# Patient Record
Sex: Female | Born: 1985 | Race: Black or African American | Hispanic: No | Marital: Single | Smoking: Never smoker
Health system: Southern US, Community
[De-identification: ages and names within clinical notes are randomized; demographics above are authoritative.]

## PROBLEM LIST (undated history)

## (undated) ENCOUNTER — Ambulatory Visit: Source: Home / Self Care

## (undated) DIAGNOSIS — Z98891 History of uterine scar from previous surgery: Secondary | ICD-10-CM

## (undated) DIAGNOSIS — Z789 Other specified health status: Secondary | ICD-10-CM

## (undated) DIAGNOSIS — B9689 Other specified bacterial agents as the cause of diseases classified elsewhere: Secondary | ICD-10-CM

## (undated) DIAGNOSIS — N760 Acute vaginitis: Secondary | ICD-10-CM

---
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
## 2003-08-01 ENCOUNTER — Emergency Department (HOSPITAL_COMMUNITY): Admission: EM | Admit: 2003-08-01 | Discharge: 2003-08-01 | Payer: Self-pay | Admitting: Emergency Medicine

## 2003-11-13 ENCOUNTER — Emergency Department (HOSPITAL_COMMUNITY): Admission: EM | Admit: 2003-11-13 | Discharge: 2003-11-13 | Payer: Self-pay | Admitting: Emergency Medicine

## 2004-01-02 ENCOUNTER — Emergency Department (HOSPITAL_COMMUNITY): Admission: EM | Admit: 2004-01-02 | Discharge: 2004-01-02 | Payer: Self-pay | Admitting: Emergency Medicine

## 2004-03-03 ENCOUNTER — Emergency Department (HOSPITAL_COMMUNITY): Admission: EM | Admit: 2004-03-03 | Discharge: 2004-03-03 | Payer: Self-pay | Admitting: Emergency Medicine

## 2004-05-21 ENCOUNTER — Emergency Department (HOSPITAL_COMMUNITY): Admission: EM | Admit: 2004-05-21 | Discharge: 2004-05-21 | Payer: Self-pay | Admitting: Emergency Medicine

## 2005-09-23 ENCOUNTER — Emergency Department (HOSPITAL_COMMUNITY): Admission: EM | Admit: 2005-09-23 | Discharge: 2005-09-23 | Payer: Self-pay | Admitting: Emergency Medicine

## 2006-03-01 ENCOUNTER — Ambulatory Visit (HOSPITAL_COMMUNITY): Admission: RE | Admit: 2006-03-01 | Discharge: 2006-03-01 | Payer: Self-pay | Admitting: Obstetrics and Gynecology

## 2006-03-24 ENCOUNTER — Inpatient Hospital Stay (HOSPITAL_COMMUNITY): Admission: AD | Admit: 2006-03-24 | Discharge: 2006-03-24 | Payer: Self-pay | Admitting: Obstetrics and Gynecology

## 2006-04-19 ENCOUNTER — Ambulatory Visit (HOSPITAL_COMMUNITY): Admission: RE | Admit: 2006-04-19 | Discharge: 2006-04-19 | Payer: Self-pay | Admitting: Obstetrics and Gynecology

## 2006-05-02 ENCOUNTER — Encounter (INDEPENDENT_AMBULATORY_CARE_PROVIDER_SITE_OTHER): Payer: Self-pay | Admitting: Specialist

## 2006-05-02 ENCOUNTER — Inpatient Hospital Stay (HOSPITAL_COMMUNITY): Admission: RE | Admit: 2006-05-02 | Discharge: 2006-05-05 | Payer: Self-pay | Admitting: Obstetrics and Gynecology

## 2006-11-15 ENCOUNTER — Inpatient Hospital Stay (HOSPITAL_COMMUNITY): Admission: AD | Admit: 2006-11-15 | Discharge: 2006-11-15 | Payer: Self-pay | Admitting: Obstetrics and Gynecology

## 2007-01-24 ENCOUNTER — Inpatient Hospital Stay (HOSPITAL_COMMUNITY): Admission: AD | Admit: 2007-01-24 | Discharge: 2007-01-24 | Payer: Self-pay | Admitting: Obstetrics & Gynecology

## 2007-04-01 ENCOUNTER — Inpatient Hospital Stay (HOSPITAL_COMMUNITY): Admission: RE | Admit: 2007-04-01 | Discharge: 2007-04-01 | Payer: Self-pay | Admitting: Obstetrics and Gynecology

## 2007-04-01 ENCOUNTER — Ambulatory Visit (HOSPITAL_COMMUNITY): Admission: RE | Admit: 2007-04-01 | Discharge: 2007-04-01 | Payer: Self-pay | Admitting: Obstetrics and Gynecology

## 2007-04-02 ENCOUNTER — Inpatient Hospital Stay (HOSPITAL_COMMUNITY): Admission: RE | Admit: 2007-04-02 | Discharge: 2007-04-04 | Payer: Self-pay | Admitting: Obstetrics and Gynecology

## 2007-04-02 ENCOUNTER — Encounter (INDEPENDENT_AMBULATORY_CARE_PROVIDER_SITE_OTHER): Payer: Self-pay | Admitting: Obstetrics and Gynecology

## 2008-08-16 ENCOUNTER — Emergency Department (HOSPITAL_COMMUNITY): Admission: EM | Admit: 2008-08-16 | Discharge: 2008-08-16 | Payer: Self-pay | Admitting: Emergency Medicine

## 2008-10-05 IMAGING — US US OB LIMITED
1 series · 14 of 18 positions shown · non-contrast
Comparison: none

CLINICAL DATA: 38 week 0 day assigned gestational age.  Breech presentation on prior ultrasound.  Reassess presentation.

[Series 1: us ob limited · 0.32mm/px · 18 acquisitions, 14 frames shown]
[im 1/18]
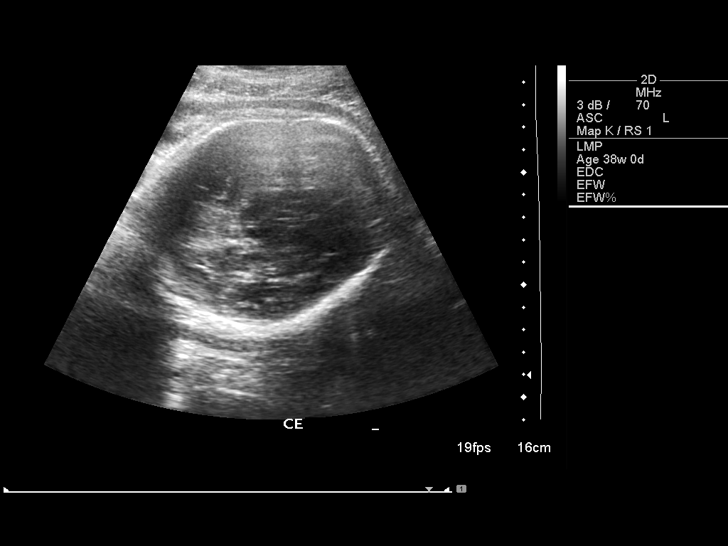
[im 2/18]
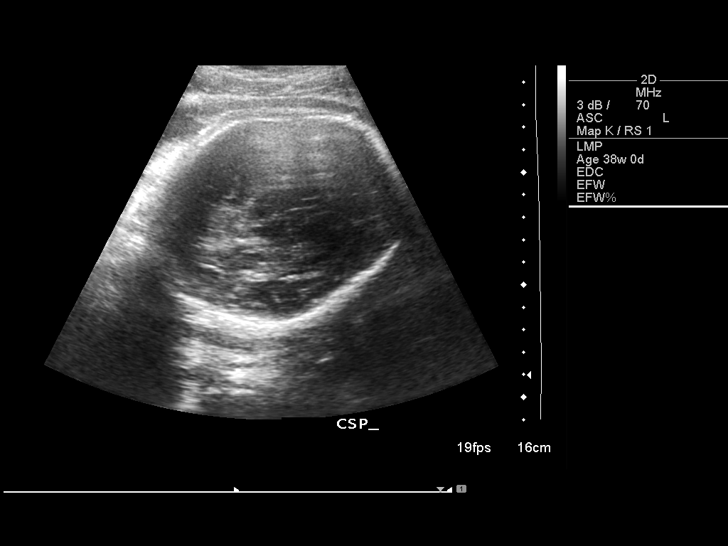
[im 4/18]
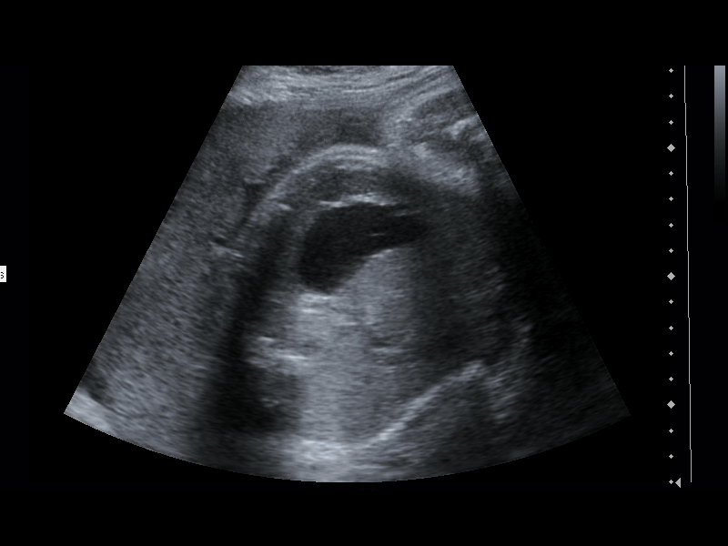
[im 5/18]
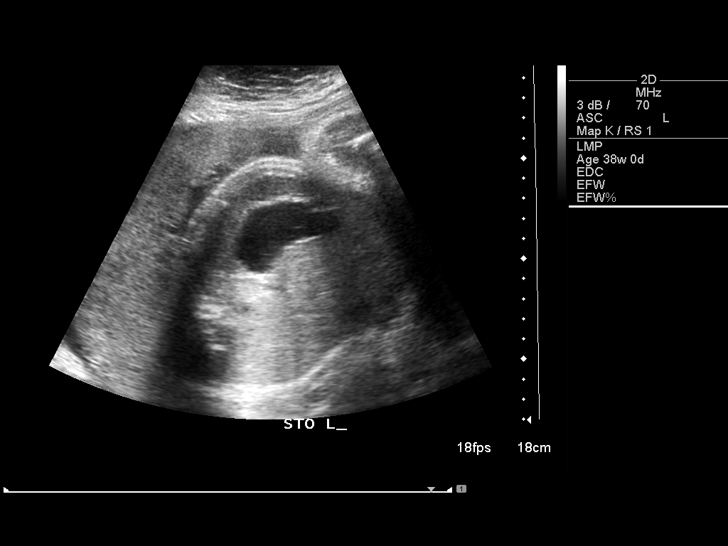
[im 6/18]
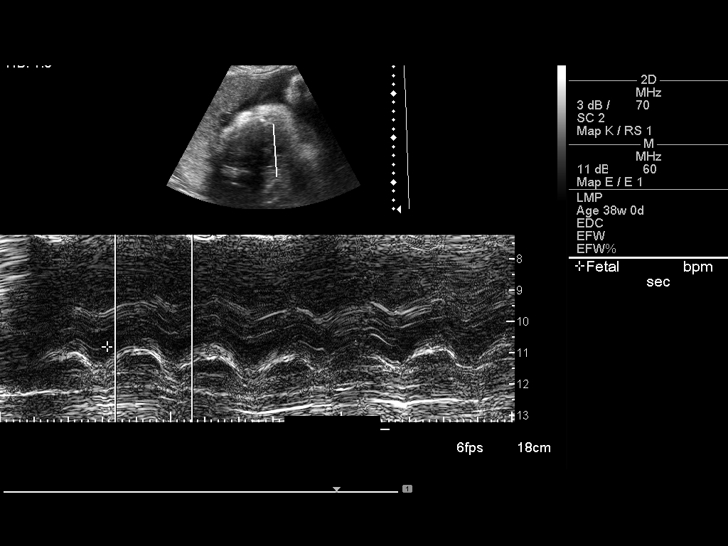
[im 8/18]
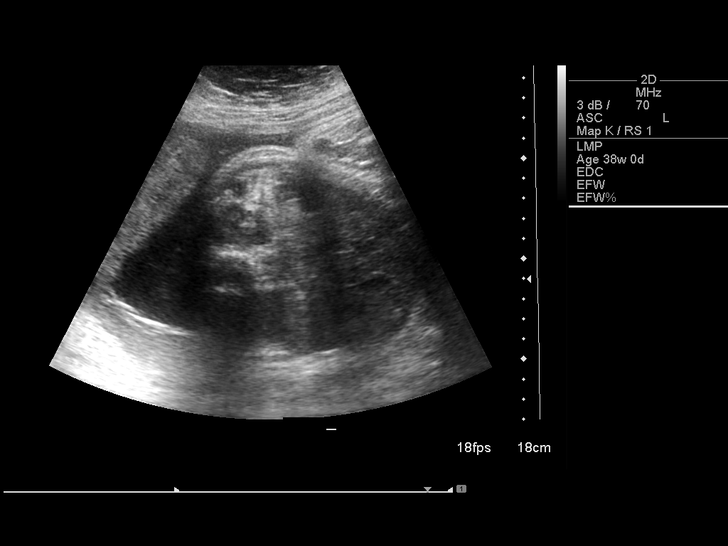
[im 9/18]
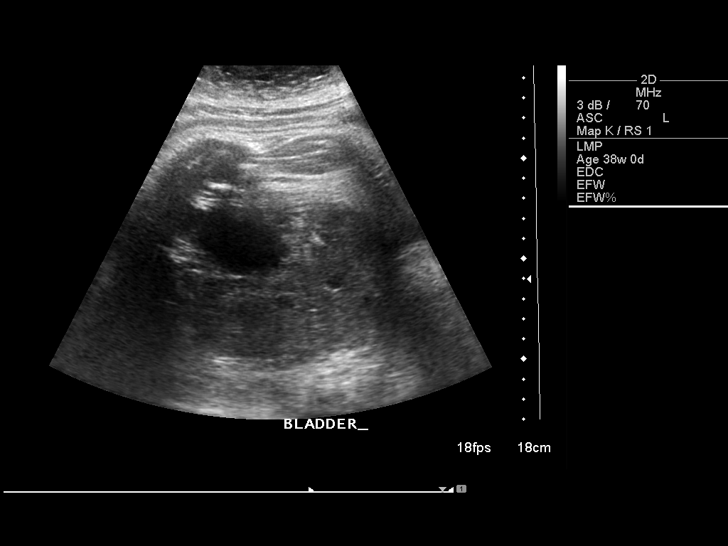
[im 10/18]
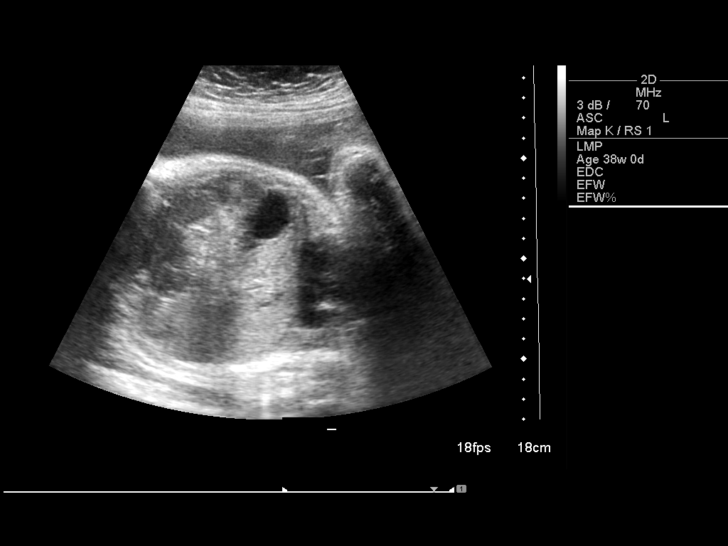
[im 11/18]
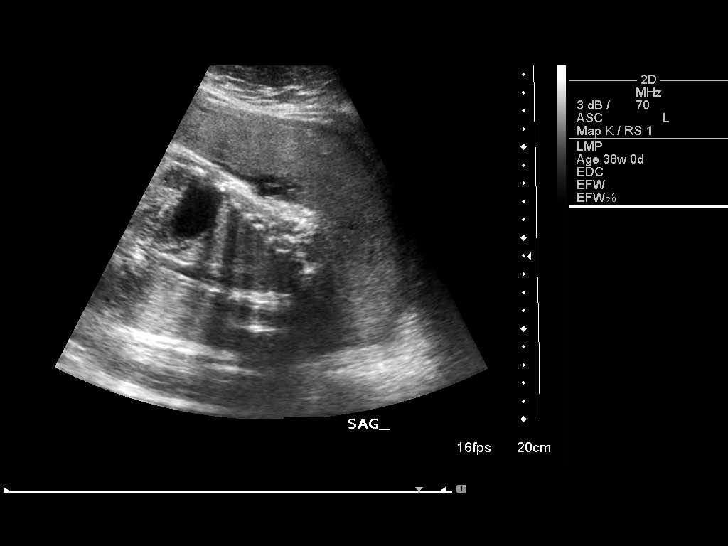
[im 13/18]
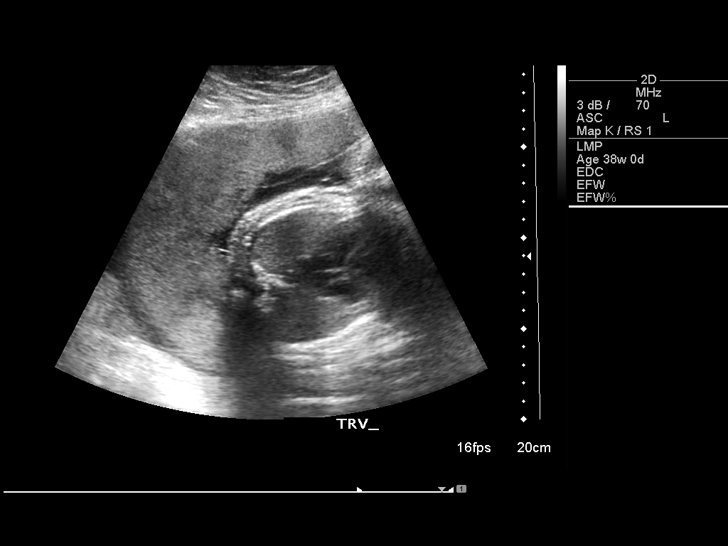
[im 14/18]
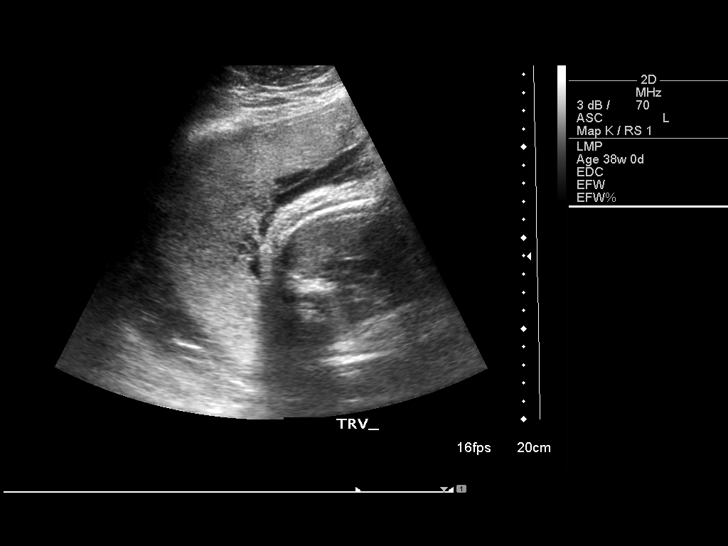
[im 15/18]
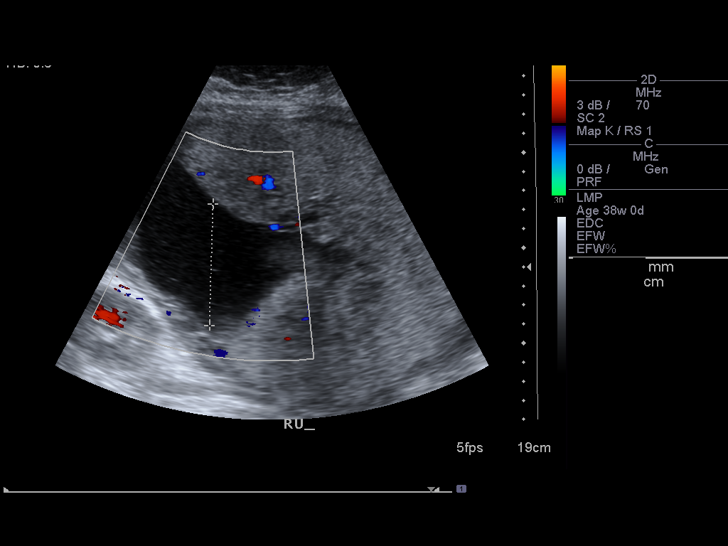
[im 17/18]
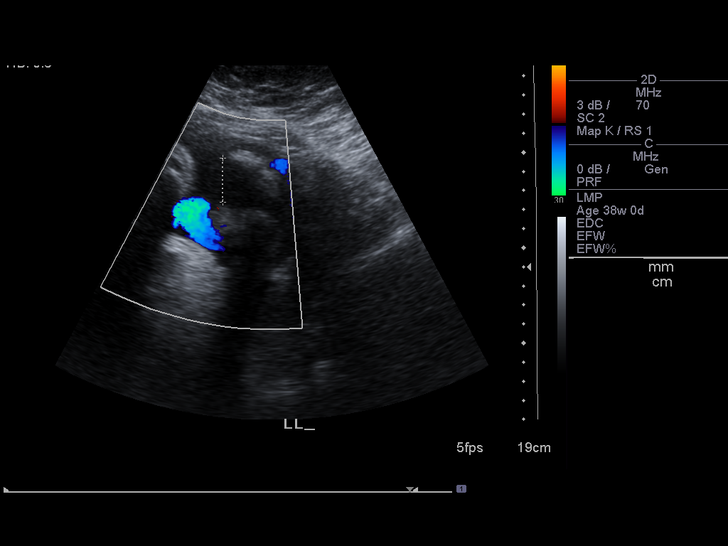
[im 18/18]
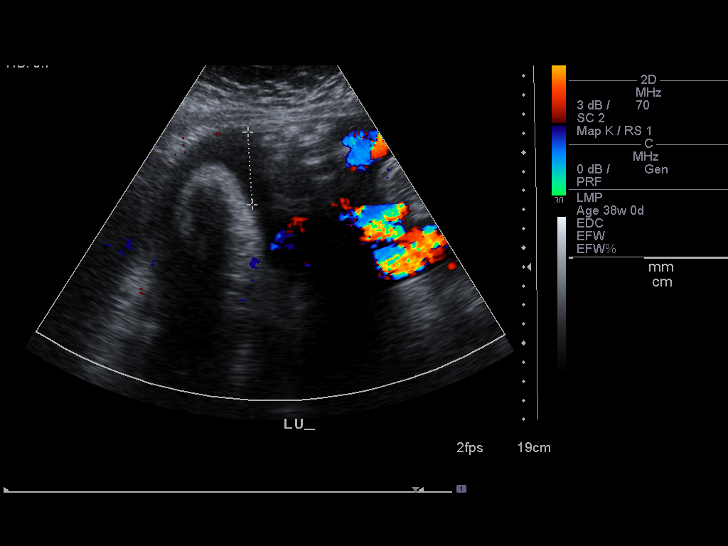

[14 of 18 positions shown; findings below may reference images not displayed]

LIMITED OBSTETRICAL ULTRASOUND:
 Number of Fetuses:  1
 Heart Rate:  133 bpm
 Movement:  Yes
 Breathing:  Yes
 Presentation:  Cephalic
 Placental Location:  Anterior
 Grade:  I
 Previa:  No
 Amniotic Fluid (Subjective):  Normal
 Amniotic Fluid (Objective):  AFI 15.9 cm (0th-00th %ile = 7.3 to 23.9 cm for 38 weeks) 

 Fetal measurements and complete anatomic evaluation were not requested.  The following fetal anatomy was visualized during this exam:  Lateral ventricles, thalami/CSP, stomach, kidneys, bladder, and diaphragm.  

 MATERNAL UTERINE AND ADNEXAL FINDINGS
 Cervix:  Not evaluated; >34 weeks.
IMPRESSION: Single living intrauterine fetus now in cephalic presentation.

## 2010-05-06 ENCOUNTER — Encounter: Payer: Self-pay | Admitting: Obstetrics and Gynecology

## 2010-05-07 ENCOUNTER — Encounter: Payer: Self-pay | Admitting: Obstetrics and Gynecology

## 2010-05-16 ENCOUNTER — Emergency Department (HOSPITAL_COMMUNITY)
Admission: EM | Admit: 2010-05-16 | Discharge: 2010-05-16 | Payer: Self-pay | Source: Home / Self Care | Admitting: Family Medicine

## 2010-05-16 LAB — POCT URINALYSIS DIPSTICK
Bilirubin Urine: NEGATIVE
Hgb urine dipstick: NEGATIVE
Ketones, ur: NEGATIVE mg/dL
Nitrite: NEGATIVE
Protein, ur: NEGATIVE mg/dL
Specific Gravity, Urine: 1.02 (ref 1.005–1.030)
Urine Glucose, Fasting: NEGATIVE mg/dL
Urobilinogen, UA: 0.2 mg/dL (ref 0.0–1.0)
pH: 7 (ref 5.0–8.0)

## 2010-05-16 LAB — POCT PREGNANCY, URINE: Preg Test, Ur: NEGATIVE

## 2010-05-31 ENCOUNTER — Emergency Department (HOSPITAL_COMMUNITY)
Admission: EM | Admit: 2010-05-31 | Discharge: 2010-05-31 | Disposition: A | Discharge status: Home / Self Care | Payer: Self-pay | Attending: Emergency Medicine | Admitting: Emergency Medicine

## 2010-05-31 DIAGNOSIS — J020 Streptococcal pharyngitis: Secondary | ICD-10-CM | POA: Insufficient documentation

## 2010-05-31 LAB — RAPID STREP SCREEN (MED CTR MEBANE ONLY): Streptococcus, Group A Screen (Direct): POSITIVE — AB

## 2010-08-29 NOTE — Op Note (Signed)
[name], [name]               ACCOUNT NO.:  [id number]   MEDICAL RECORD NO.:  [id number]          PATIENT TYPE:  INP   LOCATION:  9105                          FACILITY:  [facility]   PHYSICIAN:  Malachi Pro. Ambrose Mantle, M.D. DATE OF BIRTH:  06-06-1985   DATE OF PROCEDURE:  04/02/2007  DATE OF DISCHARGE:                               OPERATIVE REPORT   PREOPERATIVE DIAGNOSIS:  Intrauterine pregnancy, 39+ weeks, prior C-  section, declined vaginal birth after cesarean.   POSTOPERATIVE DIAGNOSIS:  Intrauterine pregnancy, 39+ weeks, prior C-  section, declined vaginal birth after cesarean.   OPERATION:  Low-transverse cervical C-section.   OPERATOR:  Malachi Pro. Ambrose Mantle, M.D.   Threasa HeadsSenaida Ores.   Spinal anesthesia.  The patient was brought to the operating room and  placed under spinal anesthesia by Dr. Pamalee Leyden.  She was placed in left  lateral tilt position.  The abdomen was prepped with Betadine solution.  A Foley catheter was inserted to drainage after this urethra was  prepped.  Exam was done to check to see how low the presenting part was,  and the vertex was very high.  The abdomen was then draped as a sterile  field.  What was felt to be her old transverse incision was utilized,  and the incision was carried through the skin, subcutaneous tissue and  fascia.  The fascia was then separated from the rectus muscles  superiorly and inferiorly.  The peritoneum was entered during the  separation of the fascia from the rectus muscle superiorly.  The  peritoneal incision was entered.  I stretched to see if I could increase  the size of the opening, then used retractors to visualize the lower  uterine segment.  The baby's head was slightly above the lower uterine  segment, so I made an incision over the head into the myometrium and  went the rest of the way into the uterus with my finger.  A large amount  of clear fluid was obtained.  The incision was enlarged transversely  with blunt  dissection.  I elevated the baby's head into the incisional  opening but could not get the baby's head out, so we placed a vacuum on  the baby's head, and then without any pop offs, the head was delivered  through the incisional opening.  The nose and pharynx was suctioned with  the bulb.  The infant's body was then delivered.  The cord was clamped,  and the infant was given to Dr. Venetia Constable was in attendance.  She  assigned the 8-pound 2-ounce infant Apgars of 9 at one and 9 at five  minutes.  The placenta was removed intact.  I inspected the inside of  the uterus.  There were few little membranes left that I removed.  I  dilated the cervix with the ring forceps and then closed the uterine  incision with a running suture of 0 Vicryl and then used another 0  Vicryl running suture to imbricate the first layer.  The first layer was  locked the second layer was nonlocking.  I had tried to elevate the  uterus out of the abdomen, but it did not seem to want to come out, but  I do not think it was adherent.  It was a little boggy at first, and I  used vigorous massage as well as Pitocin to try to increase the strength  of the uterine contractions.  I inspected both tubes and ovaries.  They  were both normal.  There was no significant bleeding from the incision.  I did put an extra figure-of-eight suture at the left angle.  I checked  the bladder.  We were well away from the bladder.  There were very dense  varicosities over the lower uterine segment.  I then closed the  abdominal wall using interrupted sutures of 0 Vicryl through the muscle  including the peritoneum, two running sutures of 0 Vicryl in the fascia,  a running 3-0 Vicryl in the subcu tissue and staples on the skin.  The  patient seemed to tolerate  the procedure well.  Blood loss was estimated at 1200 mL, although it  was difficult to tell how much was blood and how much was fluid.  I did  not feel like there had been an excessive  amount of bleeding because the  uterine edges did not seem to bleed significantly.  The patient was  returned to recovery in satisfactory condition.      Malachi Pro. Ambrose Mantle, M.D.  Electronically Signed     TFH/MEDQ  D:  04/02/2007  T:  04/03/2007  Job:  [id number]

## 2010-08-29 NOTE — H&P (Signed)
[name], [name]               ACCOUNT NO.:  [id number]   MEDICAL RECORD NO.:  [id number]          PATIENT TYPE:  OUT   LOCATION:  MFM                           [facility]ACILITY:  [facility]   PHYSICIAN:  Malachi Pro. Ambrose Mantle, M.D. DATE OF BIRTH:  1986-03-18   DATE OF ADMISSION:  04/01/2007  DATE OF DISCHARGE:                              HISTORY & PHYSICAL   PRESENT ILLNESS:  This is a 25 year old black female, para 2-0-0-2,  gravida 3, EDC April 08, 2007, by late ultrasound, admitted for  repeat C-section after pulmonary maturity studies.  Blood group and type  A-positive, negative antibodies, sickle cell negative, RPR nonreactive,  rubella immune, hepatitis B surface antigen negative, HIV negative, GC  and Chlamydia negative.  One hour Glucola 79.  Group B strep negative.  This patient began her prenatal course on February 11, 2007, at what was  thought to be [redacted] weeks gestation.  She had had an ultrasound at [facility] on October 10 that showed an average gestational age of [redacted]  weeks and 3 days, [facility] April 08, 2007.  She had a history of a vaginal  birth in May of 2003 and a cesarean section in January of 2008, and she  wanted a repeat C-section.  I advised her on her first visit that she  would need maturity studies, which she has undergone and they showed an  LS ratio of 4.3:1 and PG was present.  After beginning her prenatal  course, late in pregnancy, she has had no significant abnormalities.  Her blood pressure has remained normal.  The fundal height is high at 44  cm on March 31, 2007, but otherwise she has had no major problems.   PAST MEDICAL HISTORY:  Reveals no known allergies.  OPERATIONS:  Cesarean section in 2008.  ILLNESSES:  No significant illnesses.   FAMILY HISTORY:  Mother has high blood pressure, also has anemia.  A  sister has anemia and seizures as a child.   ALCOHOL TOBACCO AND DRUGS:  None.   OBSTETRIC HISTORY:  In May of 2008, the patient delivered a  7-pound 13-  ounce female vaginally, without complications, except she did have  increased temperature in labor.  In January of 2008, she had an 8-pound  14-ounce female infant by C-section from a transverse lie, according to  her history.  The records that I reviewed pertaining to the C-section do  not confirm the transverse lie.  According to the op note, there was an  arrest of dilatation and descent in labor and the presenting part was  vertex at a -3 station.  She underwent a low transverse cervical C-  section.   On the day prior to admission, the patient underwent an amniocentesis.  We identified a spot in the right upper quadrant of the uterus where  there did not seem to be any cord.  There did seem to be placenta  present and the marked spot was 6.5 cm from the abdominal wall.  I  inserted a needle this distance, got no fluid, got a couple of drops of  blood,  could not identify where the needle was with the ultrasound, so I  removed the needle, but during that process, the patient became hot and  sweaty and weak.  She was tilted to her left side, the uterus was  displaced to the left and fetal heart tones were confirmed at 84 with  the ultrasound, but rapidly returned to over 100.  She then was sent to  MAU after the amniocentesis was completed at a different site with clear  fluid and a nonstress test was reactive.   PHYSICAL EXAM:  Revealed a well-developed, quite obese, black female, in  no distress.  She is 5 feet 9 inches, weighs 320 pounds.  Blood pressure 110/70, pulse  of 80.  HEAD, EYES, EARS, NOSE AND THROAT:  Reveal no cranial abnormalities.  Extraocular movements are intact.  Nose and pharynx are clear.  NECK:  Supple without thyromegaly.  HEART:  Normal size and sounds, no murmurs.  LUNGS:  Clear to auscultation.  ABDOMEN:  Soft.  Fundal height is 44 cm.  Fetal heart tones are normal.  Cervix is a fingertip and long, but the vertex is present at a -4   station.   ADMITTING IMPRESSION:  Intrauterine pregnancy at 39+ weeks by dates,  confirmed by late ultrasound with positive PG and LS ratio of 4.3:1 on  amniocentesis.  History of cesarean section, declines vaginal birth after cesarean.   The patient is admitted for repeat C-section.  Her previous incision is  quite low.  I have informed her that most likely I will utilize an  incision above the area of the first one.      Malachi Pro. Ambrose Mantle, M.D.  Electronically Signed     TFH/MEDQ  D:  04/02/2007  T:  04/02/2007  Job:  [id number]

## 2010-08-29 NOTE — Discharge Summary (Signed)
[name], [name]               ACCOUNT NO.:  [id number]   MEDICAL RECORD NO.:  [id number]          PATIENT TYPE:  INP   LOCATION:  9105                          [facility]   PHYSICIAN:  Malachi Pro. Ambrose Mantle, M.D. DATE OF BIRTH:  06-16-1985   DATE OF ADMISSION:  04/02/2007  DATE OF DISCHARGE:  04/04/2007                               DISCHARGE SUMMARY   HISTORY:  The patient's history and physical is detailed in the  admitting history and physical.  In summary, the patient was admitted  for a repeat low transverse cervical C-section after declining vaginal  birth after cesarean.  She underwent a low transverse cervical C-section  by Dr. Ambrose Mantle with Dr. Senaida Ores assisting under spinal anesthesia with  delivery of an 8 pound 2 ounce female infant without Apgars of 9 at one  and 9 at five minutes.  The uterus, tubes and ovaries appeared normal.  Blood loss was difficult to estimate because of a large amount of  amniotic fluid but it was estimated at 1200 mL.  Postpartum the patient  did very well.  She tolerated a diet, ambulated well without difficulty,  passed flatus and requested discharge on the second postpartum day.  Initial hemoglobin 10.6, hematocrit 31.4, white count 7500, platelet  count 161,000, 75 segs, 16 lymphs, 7 monos, 1 eosinophil.  Followup  hemoglobin was 8.5, hematocrit 24.9, platelet count 127,000.  RPR was  nonreactive.   FINAL DIAGNOSES:  Intrauterine pregnancy at 39 plus weeks, delivered  vertex by C-section.   OPERATION:  Low transverse cervical C-section.   FINAL CONDITION:  Improved.   INSTRUCTIONS:  Include our regular discharge instruction booklet.  The  patient is advised to return to the office in 3-4 days to have her  staples removed.  Prescription for Percocet 5/325 36 tablets one or two  every 3-4 hours as needed for pain is given at discharge.  She is also  advised to take iron sulfate twice a day for the anemia.      Malachi Pro. Ambrose Mantle,  M.D.  Electronically Signed     TFH/MEDQ  D:  04/04/2007  T:  04/04/2007  Job:  [id number]

## 2011-01-19 LAB — DIFFERENTIAL
Basophils Absolute: 0
Basophils Relative: 0
Eosinophils Absolute: 0.1 — ABNORMAL LOW
Eosinophils Relative: 1
Lymphocytes Relative: 16
Lymphs Abs: 1.2
Monocytes Absolute: 0.6
Monocytes Relative: 7
Neutro Abs: 5.6
Neutrophils Relative %: 75

## 2011-01-19 LAB — RPR: RPR Ser Ql: NONREACTIVE

## 2011-01-19 LAB — CBC
HCT: 24.9 — ABNORMAL LOW
HCT: 31.4 — ABNORMAL LOW
Hemoglobin: 10.6 — ABNORMAL LOW
Hemoglobin: 8.5 — ABNORMAL LOW
MCHC: 33.8
MCHC: 34.3
MCV: 87.6
MCV: 88
Platelets: 127 — ABNORMAL LOW
Platelets: 161
RBC: 2.84 — ABNORMAL LOW
RBC: 3.57 — ABNORMAL LOW
RDW: 14.4
RDW: 14.8
WBC: 7.5
WBC: 7.5

## 2011-01-19 LAB — LSPG (L/S RATIO WITH PG)-AMNIO FLUID

## 2011-01-19 LAB — FLM(FETAL LUNG MATURITY), AMNIOTIC FLUID
Fetal/Lung Maturity: 65.5
Gestational Age: 39

## 2011-01-22 ENCOUNTER — Inpatient Hospital Stay (HOSPITAL_COMMUNITY): Payer: Medicaid Other

## 2011-01-22 ENCOUNTER — Encounter (HOSPITAL_COMMUNITY): Payer: Self-pay

## 2011-01-22 ENCOUNTER — Inpatient Hospital Stay (HOSPITAL_COMMUNITY)
Admission: AD | Admit: 2011-01-22 | Discharge: 2011-01-22 | Disposition: A | Discharge status: Home / Self Care | Payer: Medicaid Other | Source: Ambulatory Visit | Attending: Obstetrics & Gynecology | Admitting: Obstetrics & Gynecology

## 2011-01-22 ENCOUNTER — Inpatient Hospital Stay (INDEPENDENT_AMBULATORY_CARE_PROVIDER_SITE_OTHER)
Admission: RE | Admit: 2011-01-22 | Discharge: 2011-01-22 | Disposition: A | Discharge status: Home / Self Care | Payer: Self-pay | Source: Ambulatory Visit | Attending: Family Medicine | Admitting: Family Medicine

## 2011-01-22 DIAGNOSIS — Z3490 Encounter for supervision of normal pregnancy, unspecified, unspecified trimester: Secondary | ICD-10-CM

## 2011-01-22 DIAGNOSIS — N760 Acute vaginitis: Secondary | ICD-10-CM | POA: Insufficient documentation

## 2011-01-22 DIAGNOSIS — B9689 Other specified bacterial agents as the cause of diseases classified elsewhere: Secondary | ICD-10-CM | POA: Insufficient documentation

## 2011-01-22 DIAGNOSIS — O2390 Unspecified genitourinary tract infection in pregnancy, unspecified trimester: Secondary | ICD-10-CM | POA: Insufficient documentation

## 2011-01-22 DIAGNOSIS — A499 Bacterial infection, unspecified: Secondary | ICD-10-CM | POA: Insufficient documentation

## 2011-01-22 DIAGNOSIS — Z331 Pregnant state, incidental: Secondary | ICD-10-CM

## 2011-01-22 DIAGNOSIS — N949 Unspecified condition associated with female genital organs and menstrual cycle: Secondary | ICD-10-CM

## 2011-01-22 DIAGNOSIS — Z1389 Encounter for screening for other disorder: Secondary | ICD-10-CM

## 2011-01-22 DIAGNOSIS — R109 Unspecified abdominal pain: Secondary | ICD-10-CM | POA: Insufficient documentation

## 2011-01-22 HISTORY — DX: Other specified health status: Z78.9

## 2011-01-22 LAB — POCT URINALYSIS DIP (DEVICE)
Bilirubin Urine: NEGATIVE
Glucose, UA: NEGATIVE mg/dL
Hgb urine dipstick: NEGATIVE
Ketones, ur: NEGATIVE mg/dL
Leukocytes, UA: NEGATIVE
Nitrite: NEGATIVE
Protein, ur: NEGATIVE mg/dL
Specific Gravity, Urine: 1.015 (ref 1.005–1.030)
Urobilinogen, UA: 0.2 mg/dL (ref 0.0–1.0)
pH: 8.5 — ABNORMAL HIGH (ref 5.0–8.0)

## 2011-01-22 LAB — WET PREP, GENITAL: Yeast Wet Prep HPF POC: NONE SEEN

## 2011-01-22 LAB — POCT PREGNANCY, URINE: Preg Test, Ur: POSITIVE

## 2011-01-22 MED ORDER — METRONIDAZOLE 500 MG PO TABS: 500.0000 mg | ORAL_TABLET | Freq: Two times a day (BID) | ORAL | Status: AC

## 2011-01-22 NOTE — ED Provider Notes (Signed)
History     Chief Complaint  Patient presents with  . Abdominal Pain   HPI + UPT. C/O constant pelvic pain x 2-3 days. No bleeding. Was seen at Urgent Care today, had + UPT, wet prep with many clue cells (not treated for BV), was treated empirically for GC/CT d/t partner having urethral discharge and dysuria.   OB History    Grav Para Term Preterm Abortions TAB SAB Ect Mult Living   4 3 3       3       Past Medical History  Diagnosis Date  . No pertinent past medical history     Past Surgical History  Procedure Date  . Cesarean section     Breech, 2nd FTD    No family history on file.  History  Substance Use Topics  . Smoking status: Never Smoker   . Smokeless tobacco: Never Used  . Alcohol Use: No    Allergies: Allergies not on file  No prescriptions prior to admission    Review of Systems  Constitutional: Negative.   Respiratory: Negative.   Cardiovascular: Negative.   Gastrointestinal: Positive for abdominal pain. Negative for nausea, vomiting, diarrhea and constipation.  Genitourinary: Negative for dysuria, urgency, frequency, hematuria and flank pain.       Negative for vaginal bleeding  Musculoskeletal: Negative.   Neurological: Negative.   Psychiatric/Behavioral: Negative.    Physical Exam   Blood pressure 121/61, pulse 84, temperature 98.6 F (37 C), temperature source Oral, resp. rate 16, height 5\' 9"  (1.753 m), weight 140.672 kg (310 lb 2 oz), last menstrual period 11/27/2010.  Physical Exam  Constitutional: She is oriented to person, place, and time. She appears well-developed and well-nourished. No distress.  Cardiovascular: Normal rate.   Respiratory: Effort normal.  Musculoskeletal: Normal range of motion.  Neurological: She is alert and oriented to person, place, and time.  Skin: Skin is warm and dry.  Psychiatric: She has a normal mood and affect.    MAU Course  Procedures  U/S shows 8 week 4 day IUP, small subchorionic hemorrhage     Assessment and Plan  25 y.o. [id number] at [redacted]w[redacted]d IUP - pregnancy verification letter given, start prenatal care ASAP BV - rx flagyl 500 mg 1 po bid Treated today at [facility] for GC/CT   Benz Vandenberghe 01/22/2011, 4:54 PM

## 2011-01-22 NOTE — Progress Notes (Signed)
Pt states was seen at [facility], came here for further evaluation. Denies pain, bleeding or vag d/c changes at present.

## 2011-01-23 LAB — GC/CHLAMYDIA PROBE AMP, GENITAL: GC Probe Amp, Genital: NEGATIVE

## 2011-01-25 LAB — RAPID URINE DRUG SCREEN, HOSP PERFORMED
Amphetamines: NOT DETECTED
Barbiturates: NOT DETECTED
Cocaine: NOT DETECTED
Opiates: NOT DETECTED

## 2011-01-25 LAB — ABO/RH: ABO/RH(D): A POS

## 2011-01-25 LAB — DIFFERENTIAL
Eosinophils Relative: 1
Lymphocytes Relative: 14
Lymphs Abs: 1.4
Monocytes Absolute: 0.5

## 2011-01-25 LAB — RAPID STREP SCREEN (MED CTR MEBANE ONLY): Streptococcus, Group A Screen (Direct): NEGATIVE

## 2011-01-25 LAB — URINALYSIS, ROUTINE W REFLEX MICROSCOPIC
Hgb urine dipstick: NEGATIVE
Nitrite: NEGATIVE
Specific Gravity, Urine: 1.015
Urobilinogen, UA: 0.2
pH: 7.5

## 2011-01-25 LAB — CBC
HCT: 32.1 — ABNORMAL LOW
Hemoglobin: 11.1 — ABNORMAL LOW
WBC: 9.9

## 2011-01-25 LAB — TYPE AND SCREEN

## 2011-01-29 LAB — WET PREP, GENITAL
Trich, Wet Prep: NONE SEEN
Yeast Wet Prep HPF POC: NONE SEEN

## 2011-01-29 LAB — URINALYSIS, ROUTINE W REFLEX MICROSCOPIC
Bilirubin Urine: NEGATIVE
Nitrite: NEGATIVE
Specific Gravity, Urine: 1.02
Urobilinogen, UA: 4 — ABNORMAL HIGH

## 2011-01-29 LAB — GC/CHLAMYDIA PROBE AMP, GENITAL: Chlamydia, DNA Probe: NEGATIVE

## 2011-03-13 LAB — OB RESULTS CONSOLE HIV ANTIBODY (ROUTINE TESTING): HIV: NONREACTIVE

## 2011-04-11 ENCOUNTER — Other Ambulatory Visit (HOSPITAL_COMMUNITY): Payer: Self-pay | Admitting: Obstetrics and Gynecology

## 2011-04-11 DIAGNOSIS — Z0489 Encounter for examination and observation for other specified reasons: Secondary | ICD-10-CM

## 2011-05-09 ENCOUNTER — Ambulatory Visit (HOSPITAL_COMMUNITY)
Admission: RE | Admit: 2011-05-09 | Discharge: 2011-05-09 | Disposition: A | Discharge status: Home / Self Care | Payer: Medicaid Other | Source: Ambulatory Visit | Attending: Obstetrics and Gynecology | Admitting: Obstetrics and Gynecology

## 2011-05-09 ENCOUNTER — Other Ambulatory Visit (HOSPITAL_COMMUNITY): Payer: Self-pay | Admitting: Obstetrics and Gynecology

## 2011-05-09 DIAGNOSIS — Z0489 Encounter for examination and observation for other specified reasons: Secondary | ICD-10-CM

## 2011-05-09 DIAGNOSIS — Z3689 Encounter for other specified antenatal screening: Secondary | ICD-10-CM | POA: Insufficient documentation

## 2011-05-09 DIAGNOSIS — O34219 Maternal care for unspecified type scar from previous cesarean delivery: Secondary | ICD-10-CM | POA: Insufficient documentation

## 2011-07-11 ENCOUNTER — Other Ambulatory Visit: Payer: Self-pay | Admitting: Obstetrics and Gynecology

## 2011-07-24 ENCOUNTER — Other Ambulatory Visit: Payer: Self-pay | Admitting: Obstetrics and Gynecology

## 2011-08-03 ENCOUNTER — Encounter (HOSPITAL_COMMUNITY): Payer: Self-pay

## 2011-08-15 ENCOUNTER — Encounter (HOSPITAL_COMMUNITY): Payer: Self-pay

## 2011-08-15 ENCOUNTER — Encounter (HOSPITAL_COMMUNITY)
Admission: RE | Admit: 2011-08-15 | Discharge: 2011-08-15 | Disposition: A | Discharge status: Home / Self Care | Payer: Medicaid Other | Source: Ambulatory Visit | Attending: Obstetrics and Gynecology | Admitting: Obstetrics and Gynecology

## 2011-08-15 LAB — DIFFERENTIAL
Eosinophils Absolute: 0.1 10*3/uL (ref 0.0–0.7)
Eosinophils Relative: 1 % (ref 0–5)
Lymphocytes Relative: 18 % (ref 12–46)
Lymphs Abs: 1.3 10*3/uL (ref 0.7–4.0)
Monocytes Absolute: 0.4 10*3/uL (ref 0.1–1.0)
Monocytes Relative: 6 % (ref 3–12)

## 2011-08-15 LAB — COMPREHENSIVE METABOLIC PANEL
AST: 11 U/L (ref 0–37)
Albumin: 2.5 g/dL — ABNORMAL LOW (ref 3.5–5.2)
Alkaline Phosphatase: 70 U/L (ref 39–117)
BUN: 4 mg/dL — ABNORMAL LOW (ref 6–23)
CO2: 25 mEq/L (ref 19–32)
Chloride: 102 mEq/L (ref 96–112)
Creatinine, Ser: 0.52 mg/dL (ref 0.50–1.10)
GFR calc non Af Amer: >90 mL/min (ref >90)
Potassium: 4.1 mEq/L (ref 3.5–5.1)
Total Bilirubin: 0.2 mg/dL — ABNORMAL LOW (ref 0.3–1.2)

## 2011-08-15 LAB — SURGICAL PCR SCREEN: MRSA, PCR: NEGATIVE

## 2011-08-15 LAB — URINALYSIS, ROUTINE W REFLEX MICROSCOPIC
Glucose, UA: NEGATIVE mg/dL
Hgb urine dipstick: NEGATIVE
Ketones, ur: NEGATIVE mg/dL
Protein, ur: NEGATIVE mg/dL
Urobilinogen, UA: 0.2 mg/dL (ref 0.0–1.0)

## 2011-08-15 LAB — CBC
HCT: 31.5 % — ABNORMAL LOW (ref 36.0–46.0)
MCH: 27.2 pg (ref 26.0–34.0)
MCV: 85.8 fL (ref 78.0–100.0)
RBC: 3.67 MIL/uL — ABNORMAL LOW (ref 3.87–5.11)
WBC: 7.3 10*3/uL (ref 4.0–10.5)

## 2011-08-15 LAB — URINE MICROSCOPIC-ADD ON

## 2011-08-15 NOTE — Patient Instructions (Addendum)
[name]  08/15/2011   Your procedure is scheduled on:  08/23/11  Enter through the Main Entrance of [facility] at 915 AM.  Pick up the phone at the desk and dial [phone].   Call this number if you have problems the morning of surgery: [phone]   Remember:   Do not eat food:After Midnight.  Do not drink clear liquids: After Midnight.  Take these medicines the morning of surgery with A SIP OF WATER: NA   Do not wear jewelry, make-up or nail polish.  Do not wear lotions, powders, or perfumes. You may wear deodorant.  Do not shave 48 hours prior to surgery.  Do not bring valuables to the hospital.  Contacts, dentures or bridgework may not be worn into surgery.  Leave suitcase in the car. After surgery it may be brought to your room.  For patients admitted to the hospital, checkout time is 11:00 AM the day of discharge.   Patients discharged the day of surgery will not be allowed to drive home.  Name and phone number of your driver: NA  Special Instructions: CHG Shower Use Special Wash: 1/2 bottle night before surgery and 1/2 bottle morning of surgery.   Please read over the following fact sheets that you were given: MRSA Information

## 2011-08-22 MED ORDER — CEFAZOLIN SODIUM-DEXTROSE 2-3 GM-% IV SOLR
2.0000 g | INTRAVENOUS | Status: AC
  Administered 2011-08-23: 2 g via INTRAVENOUS
  Filled 2011-08-22: qty 50

## 2011-08-22 NOTE — H&P (Signed)
[name], [name]               ACCOUNT NO.:  [id number]  MEDICAL RECORD NO.:  [id number]  LOCATION:  SDC                           [facility]ILITY:  [facility]  PHYSICIAN:  Malachi Pro. Ambrose Mantle, M.D. DATE OF BIRTH:  1985-06-09  DATE OF ADMISSION:  08/15/2011 DATE OF DISCHARGE:  08/15/2011                             HISTORY & PHYSICAL   This is a 26 year old black female, para 3-0-0-3, gravida 4, who is admitted for a repeat C-section, having had 2 prior C-sections.  Her EDC is Aug 30, 2011, by an ultrasound done at 8 weeks and 4 days on January 22, 2011.  Blood group and type A positive, negative antibody, rubella is equivocal, RPR nonreactive.  Urine culture negative.  Hepatitis B surface antigen negative.  HIV negative.  TSH 1.23, hemoglobin AA, GC and Chlamydia negative.  Cystic fibrosis negative.  Quad screen negative.  One-hour Glucola 73.  Group B strep is positive.  This patient began her prenatal course in our office at 15 weeks and 5 days. She was massively obese weighing 319.5 pounds at her first visit.  She was weighed on our scales until she exceeded 350 pounds after July 31, 2011.  She has had a relatively benign prenatal course.  Since she had, had 2 previous C-sections, I did not offer her a vaginal birth and she is admitted for repeat C-section.  PAST MEDICAL HISTORY:  She has had no significant illnesses other than her massive obesity.  She has had 2 previous C-sections in January 2008 and December 2008.  She has no known allergies.  FAMILY HISTORY:  Her mother has high blood pressure and anemia and a sister has anemia and seizure disorder.  In 2003, she had a 7 pound, 12 ounce female vaginally.  In 2008, on 2 occasions, she delivered by C- section an 8 pound, 12 ounce female and 8 pound, 14 ounce female.  She denies alcohol, drugs and tobacco use.  On Aug 22, 2011, her blood pressure is 120/78, her pulse is 80.  Head, eyes, nose and throat are normal.  Heart is normal size and  sounds.  No murmurs.  Lungs are clear to auscultation.  The abdomen is soft.  Fundal height is 39 cm.  Fetal heart tones are normal.  The patient declines a cervix exam.  ADMITTING IMPRESSION:  Intrauterine pregnancy at 39 weeks, prior cesarean section x2, massive obesity.  The patient is admitted for repeat cesarean section.  She has no interest in a tubal ligation.  The patient has been counseled about the risks of surgery and is ready to proceed.     Malachi Pro. Ambrose Mantle, M.D.     TFH/MEDQ  D:  08/22/2011  T:  08/22/2011  Job:  [id number]

## 2011-08-23 ENCOUNTER — Inpatient Hospital Stay: Admit: 2011-08-23 | Payer: Self-pay | Admitting: Obstetrics and Gynecology

## 2011-08-23 ENCOUNTER — Inpatient Hospital Stay (HOSPITAL_COMMUNITY)
Admission: RE | Admit: 2011-08-23 | Discharge: 2011-08-26 | DRG: 766 | Disposition: A | Discharge status: Home / Self Care | Payer: Medicaid Other | Source: Ambulatory Visit | Attending: Obstetrics and Gynecology | Admitting: Obstetrics and Gynecology

## 2011-08-23 ENCOUNTER — Encounter (HOSPITAL_COMMUNITY): Payer: Self-pay | Admitting: Anesthesiology

## 2011-08-23 ENCOUNTER — Encounter (HOSPITAL_COMMUNITY): Payer: Self-pay | Admitting: *Deleted

## 2011-08-23 ENCOUNTER — Inpatient Hospital Stay (HOSPITAL_COMMUNITY): Payer: Medicaid Other | Admitting: Anesthesiology

## 2011-08-23 ENCOUNTER — Encounter (HOSPITAL_COMMUNITY)
Admission: RE | Disposition: A | Discharge status: Home / Self Care | Payer: Self-pay | Source: Ambulatory Visit | Attending: Obstetrics and Gynecology

## 2011-08-23 DIAGNOSIS — O34219 Maternal care for unspecified type scar from previous cesarean delivery: Principal | ICD-10-CM | POA: Diagnosis present

## 2011-08-23 DIAGNOSIS — Z98891 History of uterine scar from previous surgery: Secondary | ICD-10-CM

## 2011-08-23 HISTORY — DX: History of uterine scar from previous surgery: Z98.891

## 2011-08-23 LAB — URINALYSIS, ROUTINE W REFLEX MICROSCOPIC
Bilirubin Urine: NEGATIVE
Glucose, UA: NEGATIVE mg/dL
Ketones, ur: NEGATIVE mg/dL
Nitrite: NEGATIVE
Protein, ur: NEGATIVE mg/dL

## 2011-08-23 LAB — COMPREHENSIVE METABOLIC PANEL
ALT: 12 U/L (ref 0–35)
Alkaline Phosphatase: 82 U/L (ref 39–117)
BUN: 5 mg/dL — ABNORMAL LOW (ref 6–23)
Chloride: 103 mEq/L (ref 96–112)
GFR calc Af Amer: >90 mL/min (ref >90)
Glucose, Bld: 87 mg/dL (ref 70–99)
Potassium: 3.7 mEq/L (ref 3.5–5.1)
Total Bilirubin: 0.3 mg/dL (ref 0.3–1.2)

## 2011-08-23 LAB — TYPE AND SCREEN
ABO/RH(D): A POS
Antibody Screen: NEGATIVE

## 2011-08-23 LAB — URINE MICROSCOPIC-ADD ON

## 2011-08-23 SURGERY — Surgical Case
Anesthesia: Spinal | Site: Abdomen | Wound class: Clean Contaminated

## 2011-08-23 MED ORDER — TETANUS-DIPHTH-ACELL PERTUSSIS 5-2.5-18.5 LF-MCG/0.5 IM SUSP: 0.5000 mL | Freq: Once | INTRAMUSCULAR | Status: DC

## 2011-08-23 MED ORDER — ATROPINE SULFATE 0.4 MG/ML IJ SOLN
INTRAMUSCULAR | Status: AC
  Filled 2011-08-23: qty 1

## 2011-08-23 MED ORDER — SCOPOLAMINE 1 MG/3DAYS TD PT72
MEDICATED_PATCH | TRANSDERMAL | Status: AC
  Filled 2011-08-23: qty 1

## 2011-08-23 MED ORDER — DIPHENHYDRAMINE HCL 50 MG/ML IJ SOLN: 25.0000 mg | INTRAMUSCULAR | Status: DC | PRN

## 2011-08-23 MED ORDER — DIBUCAINE 1 % RE OINT: 1.0000 "application " | TOPICAL_OINTMENT | RECTAL | Status: DC | PRN

## 2011-08-23 MED ORDER — SENNOSIDES-DOCUSATE SODIUM 8.6-50 MG PO TABS
2.0000 | ORAL_TABLET | Freq: Every day | ORAL | Status: DC
  Administered 2011-08-23 – 2011-08-25 (×3): 2 via ORAL

## 2011-08-23 MED ORDER — PHENYLEPHRINE HCL 10 MG/ML IJ SOLN
INTRAMUSCULAR | Status: DC | PRN
  Administered 2011-08-23: 80 ug via INTRAVENOUS
  Administered 2011-08-23 (×2): 40 ug via INTRAVENOUS

## 2011-08-23 MED ORDER — SCOPOLAMINE 1 MG/3DAYS TD PT72: 1.0000 | MEDICATED_PATCH | Freq: Once | TRANSDERMAL | Status: DC

## 2011-08-23 MED ORDER — ATROPINE SULFATE 0.4 MG/ML IJ SOLN
INTRAMUSCULAR | Status: DC | PRN
  Administered 2011-08-23: 0.4 mg via INTRAVENOUS

## 2011-08-23 MED ORDER — NALBUPHINE HCL 10 MG/ML IJ SOLN
5.0000 mg | INTRAMUSCULAR | Status: DC | PRN
  Filled 2011-08-23 (×2): qty 1

## 2011-08-23 MED ORDER — SODIUM CHLORIDE 0.9 % IV SOLN
1.0000 ug/kg/h | INTRAVENOUS | Status: DC | PRN
  Filled 2011-08-23: qty 2.5

## 2011-08-23 MED ORDER — WITCH HAZEL-GLYCERIN EX PADS: 1.0000 "application " | MEDICATED_PAD | CUTANEOUS | Status: DC | PRN

## 2011-08-23 MED ORDER — ONDANSETRON HCL 4 MG/2ML IJ SOLN
INTRAMUSCULAR | Status: DC | PRN
  Administered 2011-08-23: 4 mg via INTRAVENOUS

## 2011-08-23 MED ORDER — OXYTOCIN 20 UNITS IN LACTATED RINGERS INFUSION - SIMPLE: 125.0000 mL/h | INTRAVENOUS | Status: AC

## 2011-08-23 MED ORDER — FENTANYL CITRATE 0.05 MG/ML IJ SOLN
INTRAMUSCULAR | Status: AC
  Filled 2011-08-23: qty 2

## 2011-08-23 MED ORDER — FENTANYL CITRATE 0.05 MG/ML IJ SOLN
25.0000 ug | INTRAMUSCULAR | Status: DC | PRN
  Administered 2011-08-23: 50 ug via INTRAVENOUS

## 2011-08-23 MED ORDER — CEFAZOLIN SODIUM-DEXTROSE 2-3 GM-% IV SOLR
2.0000 g | Freq: Three times a day (TID) | INTRAVENOUS | Status: AC
  Administered 2011-08-23 (×2): 2 g via INTRAVENOUS
  Filled 2011-08-23 (×2): qty 50

## 2011-08-23 MED ORDER — KETOROLAC TROMETHAMINE 30 MG/ML IJ SOLN: 30.0000 mg | Freq: Four times a day (QID) | INTRAMUSCULAR | Status: AC | PRN

## 2011-08-23 MED ORDER — IBUPROFEN 600 MG PO TABS
600.0000 mg | ORAL_TABLET | Freq: Four times a day (QID) | ORAL | Status: DC
  Administered 2011-08-23 – 2011-08-26 (×12): 600 mg via ORAL
  Filled 2011-08-23 (×12): qty 1

## 2011-08-23 MED ORDER — MEASLES, MUMPS & RUBELLA VAC ~~LOC~~ INJ
0.5000 mL | INJECTION | Freq: Once | SUBCUTANEOUS | Status: DC
  Filled 2011-08-23: qty 0.5

## 2011-08-23 MED ORDER — SCOPOLAMINE 1 MG/3DAYS TD PT72
1.0000 | MEDICATED_PATCH | Freq: Once | TRANSDERMAL | Status: DC
  Administered 2011-08-23: 1.5 mg via TRANSDERMAL

## 2011-08-23 MED ORDER — ONDANSETRON HCL 4 MG/2ML IJ SOLN
INTRAMUSCULAR | Status: AC
  Filled 2011-08-23: qty 2

## 2011-08-23 MED ORDER — MENTHOL 3 MG MT LOZG: 1.0000 | LOZENGE | OROMUCOSAL | Status: DC | PRN

## 2011-08-23 MED ORDER — EPHEDRINE SULFATE 50 MG/ML IJ SOLN
INTRAMUSCULAR | Status: DC | PRN
  Administered 2011-08-23 (×4): 10 mg via INTRAVENOUS

## 2011-08-23 MED ORDER — 0.9 % SODIUM CHLORIDE (POUR BTL) OPTIME
TOPICAL | Status: DC | PRN
  Administered 2011-08-23: 1000 mL

## 2011-08-23 MED ORDER — DIPHENHYDRAMINE HCL 25 MG PO CAPS: 25.0000 mg | ORAL_CAPSULE | ORAL | Status: DC | PRN

## 2011-08-23 MED ORDER — KETOROLAC TROMETHAMINE 60 MG/2ML IM SOLN
60.0000 mg | Freq: Once | INTRAMUSCULAR | Status: AC | PRN
  Administered 2011-08-23: 60 mg via INTRAMUSCULAR

## 2011-08-23 MED ORDER — KETOROLAC TROMETHAMINE 60 MG/2ML IM SOLN
INTRAMUSCULAR | Status: AC
  Filled 2011-08-23: qty 2

## 2011-08-23 MED ORDER — OXYCODONE-ACETAMINOPHEN 5-325 MG PO TABS
1.0000 | ORAL_TABLET | ORAL | Status: DC | PRN
  Administered 2011-08-24: 2 via ORAL
  Administered 2011-08-24 – 2011-08-26 (×10): 1 via ORAL
  Filled 2011-08-23 (×5): qty 1
  Filled 2011-08-23: qty 2
  Filled 2011-08-23 (×5): qty 1

## 2011-08-23 MED ORDER — OXYTOCIN 10 UNIT/ML IJ SOLN
INTRAMUSCULAR | Status: AC
  Filled 2011-08-23: qty 2

## 2011-08-23 MED ORDER — DIPHENHYDRAMINE HCL 50 MG/ML IJ SOLN: 12.5000 mg | INTRAMUSCULAR | Status: DC | PRN

## 2011-08-23 MED ORDER — SIMETHICONE 80 MG PO CHEW
80.0000 mg | CHEWABLE_TABLET | ORAL | Status: DC | PRN
  Administered 2011-08-24: 80 mg via ORAL

## 2011-08-23 MED ORDER — SIMETHICONE 80 MG PO CHEW
80.0000 mg | CHEWABLE_TABLET | Freq: Three times a day (TID) | ORAL | Status: DC
  Administered 2011-08-23 – 2011-08-26 (×11): 80 mg via ORAL

## 2011-08-23 MED ORDER — OXYTOCIN 10 UNIT/ML IJ SOLN
INTRAMUSCULAR | Status: DC | PRN
  Administered 2011-08-23: 5 [IU]
  Administered 2011-08-23: 20 [IU]

## 2011-08-23 MED ORDER — LANOLIN HYDROUS EX OINT: 1.0000 "application " | TOPICAL_OINTMENT | CUTANEOUS | Status: DC | PRN

## 2011-08-23 MED ORDER — MEPERIDINE HCL 25 MG/ML IJ SOLN: 6.2500 mg | INTRAMUSCULAR | Status: DC | PRN

## 2011-08-23 MED ORDER — SODIUM CHLORIDE 0.9 % IJ SOLN: 3.0000 mL | INTRAMUSCULAR | Status: DC | PRN

## 2011-08-23 MED ORDER — BUPIVACAINE IN DEXTROSE 0.75-8.25 % IT SOLN
INTRATHECAL | Status: DC | PRN
  Administered 2011-08-23: 1.5 mL via INTRATHECAL

## 2011-08-23 MED ORDER — EPHEDRINE 5 MG/ML INJ
INTRAVENOUS | Status: AC
  Filled 2011-08-23: qty 10

## 2011-08-23 MED ORDER — METOCLOPRAMIDE HCL 5 MG/ML IJ SOLN: 10.0000 mg | Freq: Three times a day (TID) | INTRAMUSCULAR | Status: DC | PRN

## 2011-08-23 MED ORDER — PHENYLEPHRINE 40 MCG/ML (10ML) SYRINGE FOR IV PUSH (FOR BLOOD PRESSURE SUPPORT)
PREFILLED_SYRINGE | INTRAVENOUS | Status: AC
  Filled 2011-08-23: qty 5

## 2011-08-23 MED ORDER — FENTANYL CITRATE 0.05 MG/ML IJ SOLN
INTRAMUSCULAR | Status: DC | PRN
  Administered 2011-08-23: 15 ug via INTRATHECAL

## 2011-08-23 MED ORDER — NALBUPHINE HCL 10 MG/ML IJ SOLN
5.0000 mg | INTRAMUSCULAR | Status: DC | PRN
  Administered 2011-08-23: 5 mg via INTRAVENOUS
  Filled 2011-08-23: qty 1

## 2011-08-23 MED ORDER — MORPHINE SULFATE 0.5 MG/ML IJ SOLN
INTRAMUSCULAR | Status: AC
  Filled 2011-08-23: qty 10

## 2011-08-23 MED ORDER — NALOXONE HCL 0.4 MG/ML IJ SOLN: 0.4000 mg | INTRAMUSCULAR | Status: DC | PRN

## 2011-08-23 MED ORDER — ONDANSETRON HCL 4 MG/2ML IJ SOLN: 4.0000 mg | Freq: Three times a day (TID) | INTRAMUSCULAR | Status: DC | PRN

## 2011-08-23 MED ORDER — LACTATED RINGERS IV SOLN
INTRAVENOUS | Status: DC
  Administered 2011-08-23: 22:00:00 via INTRAVENOUS

## 2011-08-23 MED ORDER — ZOLPIDEM TARTRATE 5 MG PO TABS: 5.0000 mg | ORAL_TABLET | Freq: Every evening | ORAL | Status: DC | PRN

## 2011-08-23 MED ORDER — OXYTOCIN 20 UNITS IN LACTATED RINGERS INFUSION - SIMPLE
INTRAVENOUS | Status: AC
  Administered 2011-08-23: 20 [IU]
  Filled 2011-08-23: qty 1000

## 2011-08-23 MED ORDER — MORPHINE SULFATE (PF) 0.5 MG/ML IJ SOLN
INTRAMUSCULAR | Status: DC | PRN
  Administered 2011-08-23: .1 mg via INTRATHECAL

## 2011-08-23 MED ORDER — DIPHENHYDRAMINE HCL 25 MG PO CAPS: 25.0000 mg | ORAL_CAPSULE | Freq: Four times a day (QID) | ORAL | Status: DC | PRN

## 2011-08-23 MED ORDER — IBUPROFEN 600 MG PO TABS: 600.0000 mg | ORAL_TABLET | Freq: Four times a day (QID) | ORAL | Status: DC | PRN

## 2011-08-23 MED ORDER — LACTATED RINGERS IV SOLN
INTRAVENOUS | Status: DC
  Administered 2011-08-23 (×4): via INTRAVENOUS

## 2011-08-23 SURGICAL SUPPLY — 36 items
CLOTH BEACON ORANGE TIMEOUT ST (SAFETY) ×2 IMPLANT
CONTAINER PREFILL 10% NBF 15ML (MISCELLANEOUS) IMPLANT
DRESSING TELFA 8X3 (GAUZE/BANDAGES/DRESSINGS) IMPLANT
DRSG VASELINE 3X18 (GAUZE/BANDAGES/DRESSINGS) ×2 IMPLANT
ELECT REM PT RETURN 9FT ADLT (ELECTROSURGICAL) ×2
ELECTRODE REM PT RTRN 9FT ADLT (ELECTROSURGICAL) ×1 IMPLANT
EXTRACTOR VACUUM KIWI (MISCELLANEOUS) IMPLANT
EXTRACTOR VACUUM M CUP 4 TUBE (SUCTIONS) IMPLANT
GAUZE SPONGE 4X4 12PLY STRL LF (GAUZE/BANDAGES/DRESSINGS) ×4 IMPLANT
GAUZE VASELINE 3X9 (GAUZE/BANDAGES/DRESSINGS) ×1 IMPLANT
GLOVE BIO SURGEON STRL SZ7.5 (GLOVE) ×4 IMPLANT
GLOVE BIOGEL M 6.5 STRL (GLOVE) ×1 IMPLANT
GLOVE BIOGEL M STRL SZ7.5 (GLOVE) ×1 IMPLANT
GOWN PREVENTION PLUS LG XLONG (DISPOSABLE) ×4 IMPLANT
GOWN PREVENTION PLUS XLARGE (GOWN DISPOSABLE) ×2 IMPLANT
KIT ABG SYR 3ML LUER SLIP (SYRINGE) IMPLANT
NDL HYPO 25X5/8 SAFETYGLIDE (NEEDLE) IMPLANT
NEEDLE HYPO 25X5/8 SAFETYGLIDE (NEEDLE) IMPLANT
NS IRRIG 1000ML POUR BTL (IV SOLUTION) ×2 IMPLANT
PACK C SECTION WH (CUSTOM PROCEDURE TRAY) ×2 IMPLANT
PAD ABD 7.5X8 STRL (GAUZE/BANDAGES/DRESSINGS) IMPLANT
RTRCTR C-SECT PINK 25CM LRG (MISCELLANEOUS) ×1 IMPLANT
SLEEVE SCD COMPRESS KNEE MED (MISCELLANEOUS) IMPLANT
SPONGE GAUZE 4X4 12PLY (GAUZE/BANDAGES/DRESSINGS) ×1 IMPLANT
STAPLER VISISTAT 35W (STAPLE) IMPLANT
SUT PLAIN 0 NONE (SUTURE) IMPLANT
SUT VIC AB 0 CT1 36 (SUTURE) ×15 IMPLANT
SUT VIC AB 3-0 CTX 36 (SUTURE) ×2 IMPLANT
SUT VIC AB 3-0 SH 27 (SUTURE) ×2
SUT VIC AB 3-0 SH 27X BRD (SUTURE) IMPLANT
SUT VIC AB 4-0 KS 27 (SUTURE) IMPLANT
SUT VICRYL 0 TIES 12 18 (SUTURE) IMPLANT
TAPE CLOTH SURG 4X10 WHT LF (GAUZE/BANDAGES/DRESSINGS) ×1 IMPLANT
TOWEL OR 17X24 6PK STRL BLUE (TOWEL DISPOSABLE) ×4 IMPLANT
TRAY FOLEY CATH 14FR (SET/KITS/TRAYS/PACK) ×2 IMPLANT
WATER STERILE IRR 1000ML POUR (IV SOLUTION) ×2 IMPLANT

## 2011-08-23 NOTE — Op Note (Signed)
Operative note on [name]  Date of the operation: 08/23/2011  Operation: Low transverse cervical C-section  Operator: Ambrose Mantle assistant: [name]  Anesthesia: Spinal Dr. Rodman Pickle  Preoperative diagnosis: Intrauterine pregnancy 39 weeks prior C-section x2  Postoperative diagnosis: Same  The patient was brought to the operating room and given a spinal anesthetic by Dr. Rodman Pickle. Her weight is over 350 pounds. She was placed in a left lateral tilt position. The abdominal wall was pulled superiorly with tape to provide better exposure to the incision. The abdomen was prepped with Betadine solution, the urethra was prepped, and a Foley catheter was inserted to straight drain. The cervix was slightly dilated, no more than 1 cm, and the vertex was presenting. The abdomen was draped as a sterile field. Anesthesia was confirmed by pinching the lower abdomen with Allis clamp. The old incision was utilized to make the incision through the skin subcutaneous tissue and fascia. The fascia was separated from the rectus muscle superiorly and inferiorly and the peritoneum was opened. There were no adhesions. The peritoneal incision was enlarged and an Alexis retractor was placed to expose the lower uterine segment. A short transverse incision was made in the lower uterine segment through the superficial layers of the myometrium. This exposed the amniotic sac and it was apparent that there was meconium-stained fluid. The incision was enlarged by pulling superiorly and inferiorly, the amniotic sac was ruptured, and the meconium-stained fluid was thin.There was a nuchal cord.the infant was female with Apgars of 8 and 9 at one and 5 minutes. The weight is pending. The placenta was removed and the uterus was boggy. I pulled the uterus out of the abdominal cavity and massaged it vigorously.this created more tone. The inside of the uterus was free of any products of conception. The uterus, both tubes and ovaries appeared  normal. The uterine incision was closed in 2 layers using a running locked suture of 0 Vicryl on the first layer nonlocking suture of the same material on the second layer.3-0 Vicryl figure-of-eight suture was required for complete hemostasis liberal irrigation confirmed hemostasis, the retractor was removed, and the abdominal wall was closed in layers. The rectus muscle and peritoneum were closed in one layer with interrupted sutures of 0 Vicryl, the fascia was closed with 2 running sutures of 0 Vicryl, the subcutaneous tissue with a running 3-0 Vicryl, and the skin was closed with staples. The patient tolerated the procedure well, sponge and needle counts were correct, and she was returned to recovery in satisfactory condition after a sterile dressing was placed on the incision

## 2011-08-23 NOTE — Transfer of Care (Signed)
Immediate Anesthesia Transfer of Care Note  Patient: [name]  Procedure(s) Performed: Procedure(s) (LRB): CESAREAN SECTION (N/A)  Patient Location: PACU  Anesthesia Type: Spinal  Level of Consciousness: awake, alert  and oriented  Airway & Oxygen Therapy: Patient Spontanous Breathing  Post-op Assessment: Report given to PACU RN and Post -op Vital signs reviewed and stable  Post vital signs: stable  Complications: No apparent anesthesia complications

## 2011-08-23 NOTE — Anesthesia Procedure Notes (Signed)
Spinal  Patient location during procedure: OR Start time: 08/23/2011 11:07 AM Staffing Performed by: anesthesiologist  Preanesthetic Checklist Completed: patient identified, site marked, surgical consent, pre-op evaluation, timeout performed, IV checked, risks and benefits discussed and monitors and equipment checked Spinal Block Patient position: sitting Prep: site prepped and draped and DuraPrep Patient monitoring: heart rate, cardiac monitor, continuous pulse ox and blood pressure Approach: midline Location: L3-4 Injection technique: single-shot Needle Needle type: Sprotte  Needle gauge: 24 G Needle length: 9 cm Assessment Sensory level: T4 Additional Notes Clear free flow CSF on first attempt.  No paresthesia.  Patient tolerated procedure well.  Jasmine December, MD

## 2011-08-23 NOTE — Progress Notes (Signed)
Patient ID: [name], female   DOB: 10/29/1985, 26 y.o.   MRN: [id number] I examined this lady on 08-22-11 and she reports no change in her health since that time.

## 2011-08-23 NOTE — Anesthesia Preprocedure Evaluation (Signed)
Anesthesia Evaluation  Patient identified by MRN, date of birth, ID band Patient awake    Reviewed: Allergy & Precautions, H&P , NPO status , Patient's Chart, lab work & pertinent test results, reviewed documented beta blocker date and time   History of Anesthesia Complications Negative for: history of anesthetic complications  Airway Mallampati: I TM Distance: >3 FB Neck ROM: full    Dental  (+) Teeth Intact   Pulmonary neg pulmonary ROS,  breath sounds clear to auscultation        Cardiovascular negative cardio ROS  Rhythm:regular Rate:Normal     Neuro/Psych negative neurological ROS  negative psych ROS   GI/Hepatic negative GI ROS, Neg liver ROS,   Endo/Other  Morbid obesity  Renal/GU negative Renal ROS  negative genitourinary   Musculoskeletal   Abdominal   Peds  Hematology negative hematology ROS (+)   Anesthesia Other Findings   Reproductive/Obstetrics (+) Pregnancy (h/o c/s x2)                           Anesthesia Physical Anesthesia Plan  ASA: III  Anesthesia Plan: Spinal   Post-op Pain Management:    Induction:   Airway Management Planned:   Additional Equipment:   Intra-op Plan:   Post-operative Plan:   Informed Consent: I have reviewed the patients History and Physical, chart, labs and discussed the procedure including the risks, benefits and alternatives for the proposed anesthesia with the patient or authorized representative who has indicated his/her understanding and acceptance.     Plan Discussed with: CRNA and Surgeon  Anesthesia Plan Comments:         Anesthesia Quick Evaluation

## 2011-08-24 ENCOUNTER — Encounter (HOSPITAL_COMMUNITY): Payer: Self-pay | Admitting: Obstetrics and Gynecology

## 2011-08-24 LAB — CBC
HCT: 27.5 % — ABNORMAL LOW (ref 36.0–46.0)
Hemoglobin: 8.7 g/dL — ABNORMAL LOW (ref 12.0–15.0)
MCH: 26.9 pg (ref 26.0–34.0)
MCHC: 31.6 g/dL (ref 30.0–36.0)
MCV: 85.1 fL (ref 78.0–100.0)
RBC: 3.23 MIL/uL — ABNORMAL LOW (ref 3.87–5.11)

## 2011-08-24 NOTE — Progress Notes (Signed)
Patient ID: [name], female   DOB: 17-Jul-1985, 26 y.o.   MRN: [id number] #1 afebrile BP normal Abdomen soft and not tender Output excellent Tolerating a diet.

## 2011-08-24 NOTE — Anesthesia Postprocedure Evaluation (Signed)
Anesthesia Post Note  Patient: [name]  Procedure(s) Performed: Procedure(s) (LRB): CESAREAN SECTION (N/A)  Anesthesia type: SAB  Patient location: Mother/Baby  Post pain: Pain level controlled  Post assessment: Post-op Vital signs reviewed  Last Vitals:  Filed Vitals:   08/24/11 0637  BP: 112/71  Pulse: 87  Temp: 36.3 C  Resp: 16    Post vital signs: Reviewed  Level of consciousness: awake  Complications: No apparent anesthesia complications

## 2011-08-24 NOTE — Progress Notes (Signed)
UR chart review completed.  

## 2011-08-25 NOTE — Progress Notes (Signed)
Subjective: Postpartum Day 2: Cesarean Delivery Patient reports incisional pain, tolerating PO and no problems voiding.  Nl lochia, pain controlled  Objective: Vital signs in last 24 hours: Temp:  [96.6 F (35.9 C)-98.3 F (36.8 C)] 97.9 F (36.6 C) (05/11 0508) Pulse Rate:  [76-88] 76  (05/11 0508) Resp:  [16-18] 18  (05/11 0508) BP: (109-123)/(73-84) 123/84 mmHg (05/11 0981)  Physical Exam:  General: alert and no distress Lochia: appropriate Uterine Fundus: firm Incision: healing well DVT Evaluation: No evidence of DVT seen on physical exam.   Basename 08/24/11 0535  HGB 8.7*  HCT 27.5*    Assessment/Plan: Status post Cesarean section. Doing well postoperatively.  continue current mgmtBOVARD,[name] 08/25/2011, 11:40 AM

## 2011-08-26 ENCOUNTER — Encounter (HOSPITAL_COMMUNITY): Payer: Self-pay | Admitting: Obstetrics and Gynecology

## 2011-08-26 DIAGNOSIS — Z98891 History of uterine scar from previous surgery: Secondary | ICD-10-CM

## 2011-08-26 HISTORY — DX: History of uterine scar from previous surgery: Z98.891

## 2011-08-26 MED ORDER — PRENATAL MULTIVITAMIN CH: 1.0000 | ORAL_TABLET | Freq: Every day | ORAL | Status: DC

## 2011-08-26 MED ORDER — IBUPROFEN 800 MG PO TABS: 800.0000 mg | ORAL_TABLET | Freq: Three times a day (TID) | ORAL | Status: AC | PRN

## 2011-08-26 MED ORDER — OXYCODONE-ACETAMINOPHEN 5-325 MG PO TABS: 1.0000 | ORAL_TABLET | Freq: Four times a day (QID) | ORAL | Status: AC | PRN

## 2011-08-26 NOTE — Discharge Summary (Signed)
Obstetric Discharge Summary Reason for Admission: cesarean section Prenatal Procedures: none Intrapartum Procedures: cesarean: low cervical, transverse Postpartum Procedures: none Complications-Operative and Postpartum: none Hemoglobin  Date Value Range Status  08/24/2011 8.7* 12.0-15.0 (g/dL) Final     HCT  Date Value Range Status  08/24/2011 27.5* 36.0-46.0 (%) Final    Physical Exam:  General: alert and no distress Lochia: appropriate Uterine Fundus: firm Incision: healing well DVT Evaluation: No evidence of DVT seen on physical exam.  Discharge Diagnoses: Term Pregnancy-delivered  Discharge Information: Date: 08/26/2011 Activity: pelvic rest Diet: routine Medications: PNV, Ibuprofen and Percocet Condition: stable Instructions: refer to practice specific booklet Discharge to: home Follow-up Information    Follow up with Bing Plume, MD. Schedule an appointment as soon as possible for a visit in 1 week. (for staple removal)    Contact information:   [employer] [employer] [address] [place], [address] [place] [address] [phone]          Newborn Data: Live born female  Birth Weight: 8 lb 8.2 oz (3860 g) APGAR: 8, 9  Home with mother.  [name],Larron Armor 08/26/2011, 10:03 AM

## 2011-08-26 NOTE — Progress Notes (Signed)
Subjective: Postpartum Day 3: Cesarean Delivery Patient reports incisional pain, tolerating PO and no problems voiding.  Nl lochia, pain controlled.    Objective: Vital signs in last 24 hours: Temp:  [98.4 F (36.9 C)-98.7 F (37.1 C)] 98.4 F (36.9 C) (05/12 0520) Pulse Rate:  [82-86] 86  (05/12 0520) Resp:  [18-20] 20  (05/12 0520) BP: (115-125)/(71-81) 124/71 mmHg (05/12 0520)  Physical Exam:  General: alert and no distress Lochia: appropriate Uterine Fundus: firm Incision: healing well DVT Evaluation: No evidence of DVT seen on physical exam.   Basename 08/24/11 0535  HGB 8.7*  HCT 27.5*    Assessment/Plan: Status post Cesarean section. Doing well postoperatively.  Discharge home with standard precautions and return to clinic in 2 weeks.  D/c with motrin/percocet/ pnv.    [name],Camrie Stock 08/26/2011, 9:54 AM

## 2013-02-17 ENCOUNTER — Encounter (HOSPITAL_COMMUNITY): Payer: Self-pay | Admitting: Emergency Medicine

## 2013-02-17 DIAGNOSIS — Z3202 Encounter for pregnancy test, result negative: Secondary | ICD-10-CM | POA: Insufficient documentation

## 2013-02-17 DIAGNOSIS — R109 Unspecified abdominal pain: Secondary | ICD-10-CM | POA: Insufficient documentation

## 2013-02-17 DIAGNOSIS — Z79899 Other long term (current) drug therapy: Secondary | ICD-10-CM | POA: Insufficient documentation

## 2013-02-17 DIAGNOSIS — N898 Other specified noninflammatory disorders of vagina: Secondary | ICD-10-CM | POA: Insufficient documentation

## 2013-02-17 LAB — URINALYSIS, ROUTINE W REFLEX MICROSCOPIC
Bilirubin Urine: NEGATIVE
Glucose, UA: NEGATIVE mg/dL
Hgb urine dipstick: NEGATIVE
Nitrite: NEGATIVE
Specific Gravity, Urine: 1.004 — ABNORMAL LOW (ref 1.005–1.030)
pH: 7 (ref 5.0–8.0)

## 2013-02-17 LAB — CBC WITH DIFFERENTIAL/PLATELET
Eosinophils Relative: 2 % (ref 0–5)
HCT: 37.9 % (ref 36.0–46.0)
Hemoglobin: 13 g/dL (ref 12.0–15.0)
Lymphocytes Relative: 36 % (ref 12–46)
Lymphs Abs: 3.1 10*3/uL (ref 0.7–4.0)
MCH: 30.8 pg (ref 26.0–34.0)
MCV: 89.8 fL (ref 78.0–100.0)
Monocytes Absolute: 0.7 10*3/uL (ref 0.1–1.0)
Monocytes Relative: 9 % (ref 3–12)
RBC: 4.22 MIL/uL (ref 3.87–5.11)
WBC: 8.7 10*3/uL (ref 4.0–10.5)

## 2013-02-17 NOTE — ED Notes (Signed)
Pt. reports vaginal discharge with dysuria and mid abdominal cramping for several days . No nausea /vomitting or diarrhea.

## 2013-02-18 ENCOUNTER — Emergency Department (HOSPITAL_COMMUNITY)
Admission: EM | Admit: 2013-02-18 | Discharge: 2013-02-18 | Disposition: A | Discharge status: Home / Self Care | Payer: Medicaid Other | Attending: Emergency Medicine | Admitting: Emergency Medicine

## 2013-02-18 DIAGNOSIS — R109 Unspecified abdominal pain: Secondary | ICD-10-CM

## 2013-02-18 DIAGNOSIS — N898 Other specified noninflammatory disorders of vagina: Secondary | ICD-10-CM

## 2013-02-18 LAB — WET PREP, GENITAL
Clue Cells Wet Prep HPF POC: NONE SEEN
Trich, Wet Prep: NONE SEEN
Yeast Wet Prep HPF POC: NONE SEEN

## 2013-02-18 LAB — COMPREHENSIVE METABOLIC PANEL
AST: 16 U/L (ref 0–37)
Albumin: 3.7 g/dL (ref 3.5–5.2)
BUN: 10 mg/dL (ref 6–23)
Creatinine, Ser: 0.86 mg/dL (ref 0.50–1.10)
Potassium: 3.5 mEq/L (ref 3.5–5.1)
Total Protein: 7.7 g/dL (ref 6.0–8.3)

## 2013-02-18 LAB — GC/CHLAMYDIA PROBE AMP: CT Probe RNA: NEGATIVE

## 2013-02-18 LAB — LIPASE, BLOOD: Lipase: 43 U/L (ref 11–59)

## 2013-02-18 MED ORDER — CEFTRIAXONE SODIUM 250 MG IJ SOLR
250.0000 mg | Freq: Once | INTRAMUSCULAR | Status: AC
  Administered 2013-02-18: 250 mg via INTRAMUSCULAR
  Filled 2013-02-18: qty 250

## 2013-02-18 MED ORDER — AZITHROMYCIN 250 MG PO TABS
1000.0000 mg | ORAL_TABLET | Freq: Once | ORAL | Status: AC
  Administered 2013-02-18: 1000 mg via ORAL
  Filled 2013-02-18: qty 4

## 2013-02-18 NOTE — ED Notes (Signed)
PELVIC CART COMPLETED. ORDERS COMPLETED.

## 2013-02-18 NOTE — ED Provider Notes (Signed)
CSN: [id number]     Arrival date & time 02/17/13  2303 History   First MD Initiated Contact with Patient 02/18/13 0052     Chief Complaint  Patient presents with  . Vaginal Discharge   (Consider location/radiation/quality/duration/timing/severity/associated sxs/prior Treatment) Patient is a 27 y.o. female presenting with vaginal discharge. The history is provided by the patient.  Vaginal Discharge Associated symptoms: no abdominal pain, no fever and no vomiting   pt c/o vaginal discharge in past couple days. Denies malodor. Unsure of color.  Pt notes intermittent mid to upper abd cramping.  No specific exacerbating or alleviating factors. No radiation.  No constant/focal abd pain. No fever or chills. Normal appetite. No nvd. ?mild dysuria, no urgency/freq. No back or flank pain. No known std exposure.     Past Medical History  Diagnosis Date  . No pertinent past medical history   . S/P cesarean section 08/26/2011   Past Surgical History  Procedure Laterality Date  . Cesarean section      Breech, 2nd FTD  . Cesarean section  08/23/2011    Procedure: CESAREAN SECTION;  Surgeon: Bing Plume, MD;  Location: [facility];  Service: Gynecology;  Laterality: N/A;  Repeat   No family history on file. History  Substance Use Topics  . Smoking status: Never Smoker   . Smokeless tobacco: Never Used  . Alcohol Use: No   OB History   Grav Para Term Preterm Abortions TAB SAB Ect Mult Living   4 4 4       4      Review of Systems  Constitutional: Negative for fever and chills.  HENT: Negative for sore throat.   Eyes: Negative for redness.  Respiratory: Negative for cough and shortness of breath.   Cardiovascular: Negative for chest pain.  Gastrointestinal: Negative for vomiting, abdominal pain, diarrhea and constipation.  Genitourinary: Positive for vaginal discharge. Negative for flank pain and vaginal bleeding.  Musculoskeletal: Negative for back pain and neck pain.  Skin: Negative for  rash.  Neurological: Negative for headaches.  Hematological: Does not bruise/bleed easily.  Psychiatric/Behavioral: Negative for confusion.    Allergies  Review of patient's allergies indicates no known allergies.  Home Medications   Current Outpatient Rx  Name  Route  Sig  Dispense  Refill  . phentermine 37.5 MG capsule   Oral   Take 37.5 mg by mouth daily.          BP 138/81  Pulse 103  Temp(Src) 99 F (37.2 C) (Oral)  Resp 20  Ht 5\' 9"  (1.753 m)  Wt 302 lb 0.5 oz (137 kg)  BMI 44.58 kg/m2  SpO2 100%  LMP 02/02/2013 Physical Exam  Nursing note and vitals reviewed. Constitutional: She appears well-developed and well-nourished. No distress.  Eyes: Conjunctivae are normal. No scleral icterus.  Neck: Neck supple. No tracheal deviation present.  Cardiovascular: Normal rate, regular rhythm, normal heart sounds and intact distal pulses.   Pulmonary/Chest: Effort normal and breath sounds normal. No respiratory distress.  Abdominal: Soft. Normal appearance and bowel sounds are normal. She exhibits no distension and no mass. There is no tenderness. There is no rebound and no guarding.  Genitourinary:  No cva tenderness. Mild vaginal discharge, whitish.  No cmt. No focal adx tenderness or mass.   Musculoskeletal: She exhibits no edema.  Neurological: She is alert.  Skin: Skin is warm and dry. No rash noted. She is not diaphoretic.  Psychiatric: She has a normal mood and affect.  ED Course  Procedures (including critical care time) Results for orders placed during the hospital encounter of 02/18/13  WET PREP, GENITAL      Result Value Range   Yeast Wet Prep HPF POC NONE SEEN  NONE SEEN   Trich, Wet Prep NONE SEEN  NONE SEEN   Clue Cells Wet Prep HPF POC NONE SEEN  NONE SEEN   WBC, Wet Prep HPF POC FEW (*) NONE SEEN  COMPREHENSIVE METABOLIC PANEL      Result Value Range   Sodium 138  135 - 145 mEq/L   Potassium 3.5  3.5 - 5.1 mEq/L   Chloride 104  96 - 112 mEq/L    CO2 25  19 - 32 mEq/L   Glucose, Bld 95  70 - 99 mg/dL   BUN 10  6 - 23 mg/dL   Creatinine, Ser 1.61  0.50 - 1.10 mg/dL   Calcium 9.2  8.4 - 09.6 mg/dL   Total Protein 7.7  6.0 - 8.3 g/dL   Albumin 3.7  3.5 - 5.2 g/dL   AST 16  0 - 37 U/L   ALT 11  0 - 35 U/L   Alkaline Phosphatase 59  39 - 117 U/L   Total Bilirubin 0.2 (*) 0.3 - 1.2 mg/dL   GFR calc non Af Amer >90  >90 mL/min   GFR calc Af Amer >90  >90 mL/min  CBC WITH DIFFERENTIAL      Result Value Range   WBC 8.7  4.0 - 10.5 K/uL   RBC 4.22  3.87 - 5.11 MIL/uL   Hemoglobin 13.0  12.0 - 15.0 g/dL   HCT 04.5  40.9 - 81.1 %   MCV 89.8  78.0 - 100.0 fL   MCH 30.8  26.0 - 34.0 pg   MCHC 34.3  30.0 - 36.0 g/dL   RDW 91.4  78.2 - 95.6 %   Platelets 222  150 - 400 K/uL   Neutrophils Relative % 53  43 - 77 %   Neutro Abs 4.6  1.7 - 7.7 K/uL   Lymphocytes Relative 36  12 - 46 %   Lymphs Abs 3.1  0.7 - 4.0 K/uL   Monocytes Relative 9  3 - 12 %   Monocytes Absolute 0.7  0.1 - 1.0 K/uL   Eosinophils Relative 2  0 - 5 %   Eosinophils Absolute 0.2  0.0 - 0.7 K/uL   Basophils Relative 0  0 - 1 %   Basophils Absolute 0.0  0.0 - 0.1 K/uL  LIPASE, BLOOD      Result Value Range   Lipase 43  11 - 59 U/L  URINALYSIS, ROUTINE W REFLEX MICROSCOPIC      Result Value Range   Color, Urine YELLOW  YELLOW   APPearance CLEAR  CLEAR   Specific Gravity, Urine 1.004 (*) 1.005 - 1.030   pH 7.0  5.0 - 8.0   Glucose, UA NEGATIVE  NEGATIVE mg/dL   Hgb urine dipstick NEGATIVE  NEGATIVE   Bilirubin Urine NEGATIVE  NEGATIVE   Ketones, ur NEGATIVE  NEGATIVE mg/dL   Protein, ur NEGATIVE  NEGATIVE mg/dL   Urobilinogen, UA 0.2  0.0 - 1.0 mg/dL   Nitrite NEGATIVE  NEGATIVE   Leukocytes, UA NEGATIVE  NEGATIVE  POCT PREGNANCY, URINE      Result Value Range   Preg Test, Ur NEGATIVE  NEGATIVE       EKG Interpretation   None       MDM  Labs.  Recheck abd soft nt.  No cmt or indication pid on pelvic exam.  Mild discharge. Wet prep noted.  Gc/chlam pending. Will rx. Rocephin 250 im, zithromax po.  abd soft nt. Afeb. Pt stable for d/c.       Suzi Roots, MD 02/18/13 [phone]

## 2013-10-25 IMAGING — US US OB COMP +14 WK
2 series · 12 of 28 positions shown · non-contrast
Comparison: none

[Series 1: us ob comp +14 wk · 11 of 86 slices shown (1 of 2)]
[im 4/86]
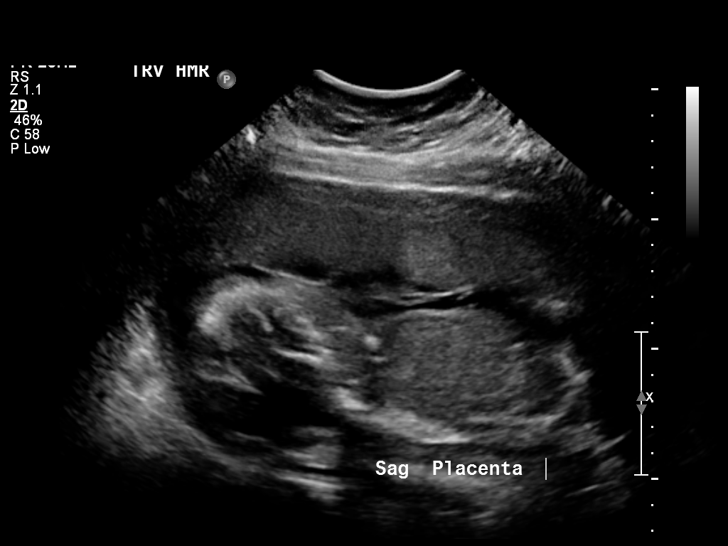
[im 11/86]
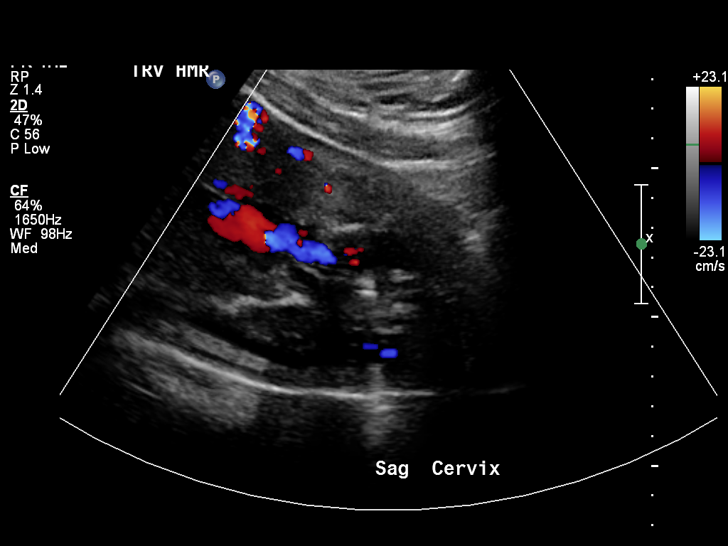
[im 18/86]
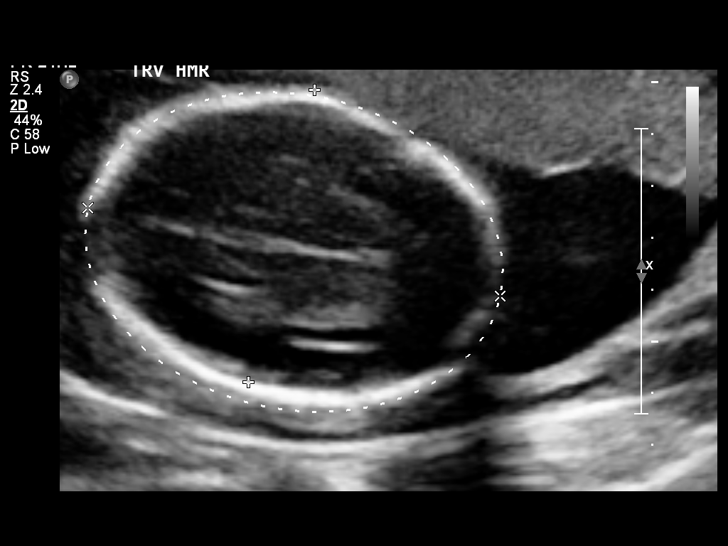
[im 29/86]
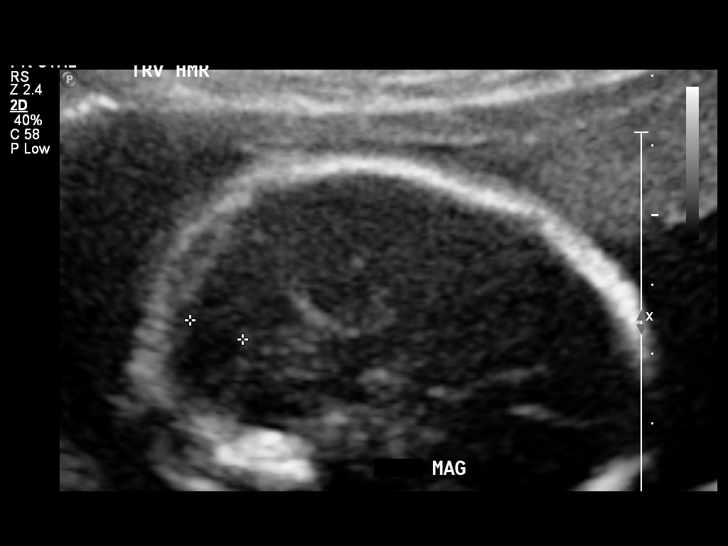
[im 36/86]
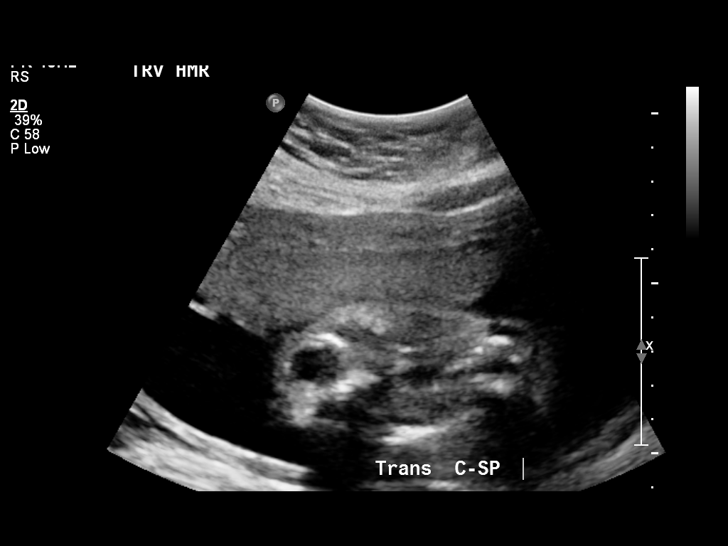
[im 43/86]
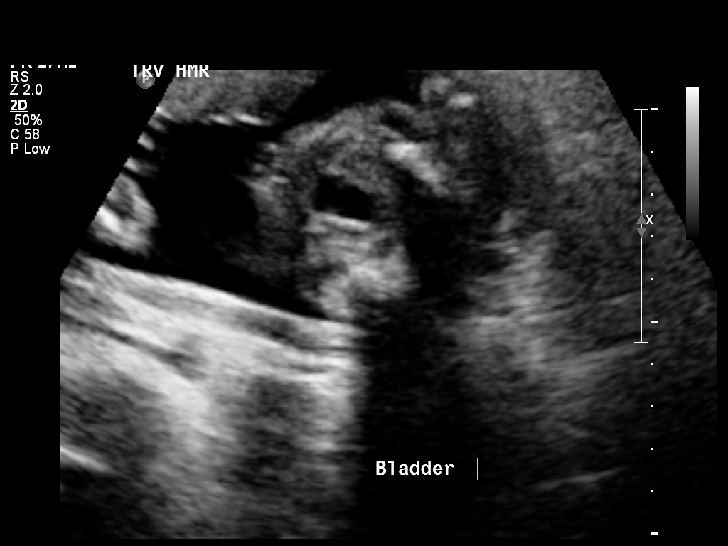
[im 54/86]
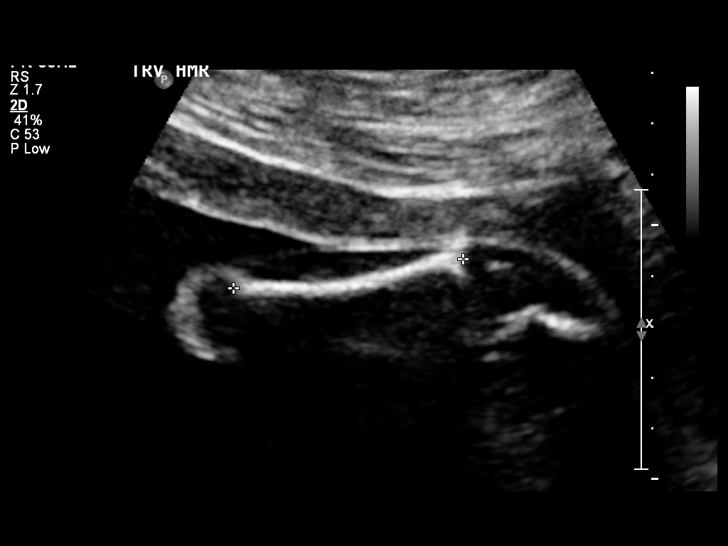
[im 61/86]
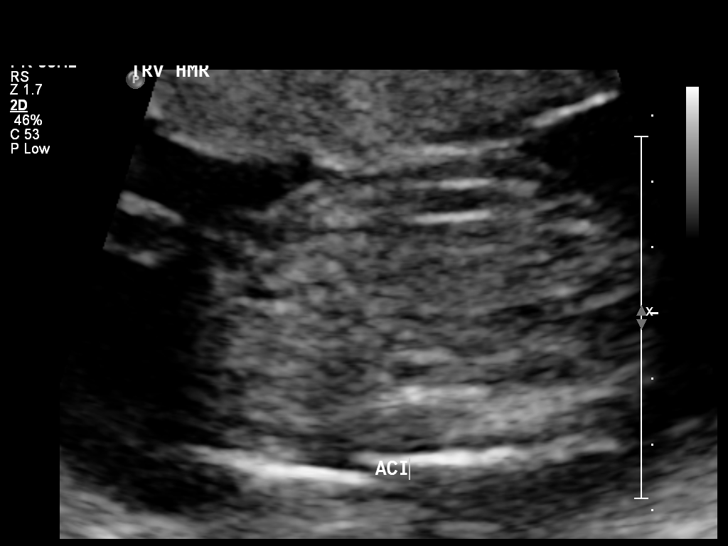
[im 68/86]
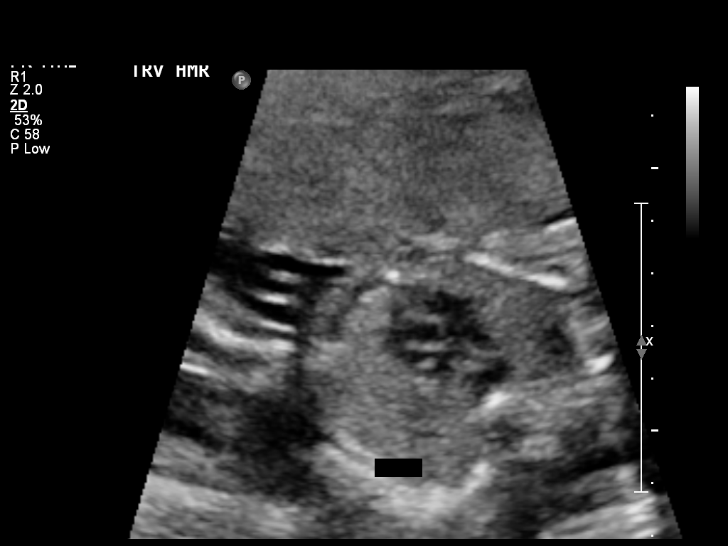
[im 78/86]
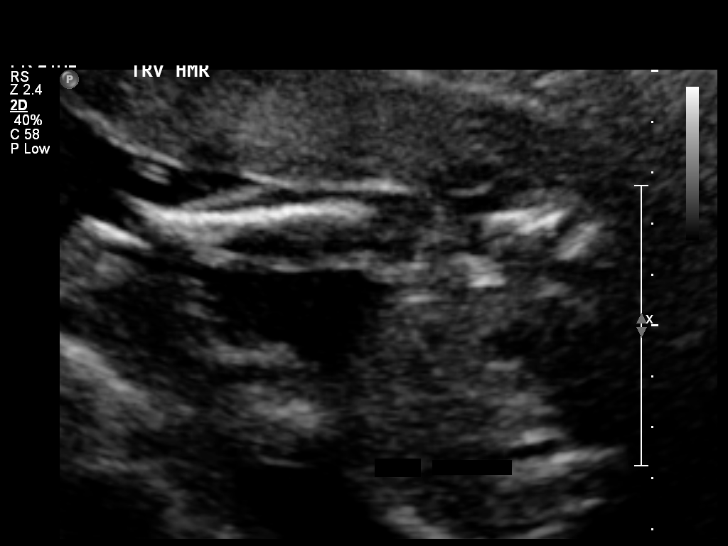
[im 86/86]
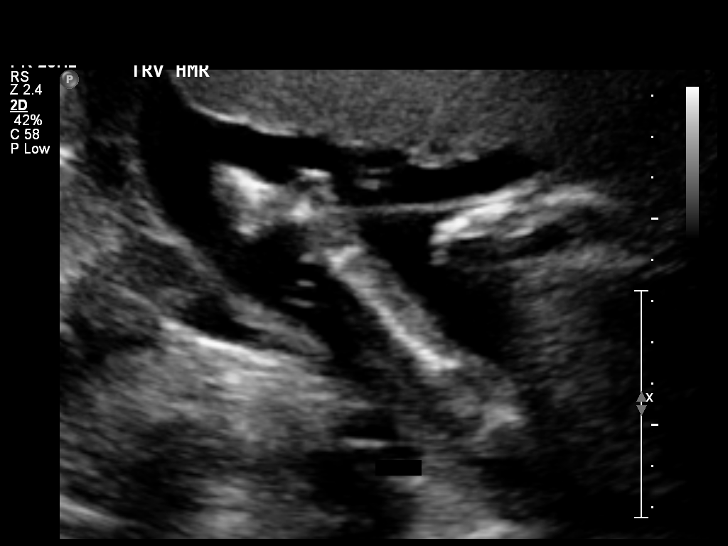

[Series 1: us ob comp +14 wk · 1 of 9 slices shown (2 of 2)]
[im 5/9]
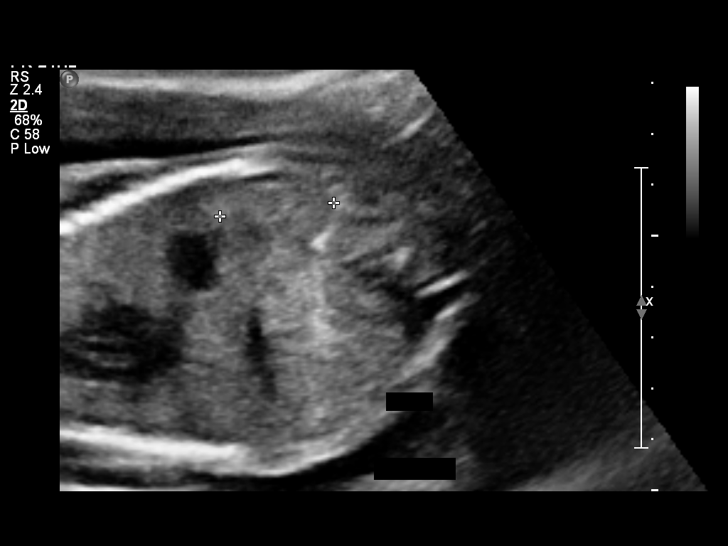

[12 of 28 positions shown; findings below may reference images not displayed]

OBSTETRICS REPORT
                      (Signed Final 05/09/2011 [DATE])

 Order#:         [id number]
Procedures

 US OB COMP + 14 WK                                    76805.1
Indications

 Previous cesarean section x 2
 Basic anatomic survey
Fetal Evaluation

 Fetal Heart Rate:  139                         bpm
 Cardiac Activity:  Observed
 Presentation:      Transverse, head to
                    maternal right
 Placenta:          Anterior, above cervical os
 P. Cord            Visualized
 Insertion:

 Amniotic Fluid
 AFI FV:      Subjectively within normal limits
                                             Larg Pckt:     6.7  cm
Biometry

 BPD:     56.8  mm    G. Age:   23w 2d                CI:           66   70 - 86
                                                      FL/HC:      19.7   18.7 -

 HC:     224.3  mm    G. Age:   24w 3d       56  %    HC/AC:      1.12   1.05 -

 AC:     200.9  mm    G. Age:   24w 5d       68  %    FL/BPD:     77.6   71 - 87
 FL:      44.1  mm    G. Age:   24w 4d       58  %    FL/AC:      22.0   20 - 24
 Est. FW:     708  gm      1 lb 9 oz     65  %
Gestational Age

 LMP:           23w 2d       Date:   11/27/10                 EDD:   09/03/11
 U/S Today:     24w 2d                                        EDD:   08/27/11
 Best:          23w 6d    Det. By:   U/S C R L (01/22/11)     EDD:   08/30/11
Anatomy

 Cranium:           Appears normal      Aortic Arch:       Basic anatomy
                                                           exam per order
 Fetal Cavum:       Appears normal      Ductal Arch:       Basic anatomy
                                                           exam per order
 Ventricles:        Appears normal      Diaphragm:         Appears normal
 Choroid Plexus:    Appears normal      Stomach:           Appears
                                                           normal, left
                                                           sided
 Cerebellum:        Not well            Abdomen:           Appears normal
                    visualized
 Posterior Fossa:   Not well            Abdominal Wall:    Appears nml
                    visualized                             (cord insert,
                                                           abd wall)
 Nuchal Fold:       Not applicable      Cord Vessels:      Appears normal
                    (>20 wks GA)                           (3 vessel cord)
 Face:              Lips appear         Kidneys:           Not well
                    normal                                 visualized
 Heart:             Appears normal      Bladder:           Appears normal
                    (4 chamber &
                    axis)
 RVOT:              Not well            Spine:             Not well
                    visualized                             visualized
 LVOT:              Not well            Limbs:             Four extremities
                    visualized                             visualized
                                                           (basic anatomy
                                                           exam)

 Other:     Nasal bone visualized. Fetus appears to be a female.
            Technically difficult due to maternal habitus and fetal
            position.
Cervix Uterus Adnexa

 Cervical Length:   3.67      cm

 Cervix:       Normal appearance by transabdominal scan.

 Adnexa:     No abnormality visualized.
Impression

 Single live IUP in transverse, head maternal right,
 presentation.
 Suboptimal visualization due to maternal body habitus.
 Concordant measurements/assigned GA by US.
 No gross abnormalities identified, although multiple
 structures not well visualized as listed above.

## 2014-02-15 ENCOUNTER — Encounter (HOSPITAL_COMMUNITY): Payer: Self-pay | Admitting: Emergency Medicine

## 2014-03-03 ENCOUNTER — Ambulatory Visit: Payer: Medicaid Other | Admitting: Dietician

## 2014-04-13 ENCOUNTER — Ambulatory Visit: Payer: Medicaid Other | Admitting: Dietician

## 2014-05-11 ENCOUNTER — Encounter: Payer: Self-pay | Admitting: Dietician

## 2014-05-11 ENCOUNTER — Encounter: Payer: Medicaid Other | Attending: Obstetrics & Gynecology | Admitting: Dietician

## 2014-05-11 DIAGNOSIS — Z6841 Body Mass Index (BMI) 40.0 and over, adult: Secondary | ICD-10-CM | POA: Insufficient documentation

## 2014-05-11 DIAGNOSIS — Z713 Dietary counseling and surveillance: Secondary | ICD-10-CM | POA: Diagnosis not present

## 2014-05-11 NOTE — Patient Instructions (Addendum)
Begin exercising-walking with family with goal of 30 minutes 5 times per week.  Consider swimming. Be mindful when you eat. Slow down. Eat together as a family as much as possible.  No TV or electronics. Great job with making changes about what you drink!  Consider drinking less lemonade and juice. Great job with baking more rather than frying! Less processed food, more whole grains.

## 2014-05-11 NOTE — Progress Notes (Signed)
  Medical Nutrition Therapy:  Appt start time: 1515 end time:  1430.   Assessment:  Primary concerns today: Help with weight loss.  She has taken phentermine in the past but quit secondary to headaches.  She has recently been drinking more water, and eating more veges and fruits and has not eaten at resteraunts.  Plans to begin walking.  Started making these changes in December.  Weight was 330 lbs.  She is concerned that her back aches a lot and that weight may be contributing.  Patient always has struggled with weight.  She has dark skin on front and back of neck,   Consider checking for PCOS.  Patient is here alone.  She lives with her four children ages 2812, 628, 37 and 2.  Three are girls and also struggle with weight.  Patient does all of the shopping and cooking.  She is a Social workerhair stylist.  Preferred Learning Style:   No preference indicated   Learning Readiness:  Ready  Change in progress  MEDICATIONS: none   DIETARY INTAKE: 24-hr recall:  B ( AM): 2 pancakes with syrup, eggs or [place] sausage/bacon or bowl of raisin bran and 1% milk and fruit or leftovers from dinner and sometimes skips  Snk ( AM): none  L ( PM): Hamburger helper or pizza in oven, or potatoes or salad or sandwich Snk ( PM): protein granola bar or smoothie made with cucumbers, spinach, and apple D ( PM): meat and 2 veges Snk ( PM): protein granola bar or fruit Beverages: water, lemonaide, juice, milk, green tea (stopped drinking soda)  Usual physical activity: has not started due to time (work and children)  Estimated energy needs: 1800 calories 200 g carbohydrates 135 g protein 50 g fat  Progress Towards Goal(s):  In progress.   Nutritional Diagnosis:  NB-1.1 Food and nutrition-related knowledge deficit As related to balance of carbohydrates, protein, and fats.  As evidenced by diet hx..    Intervention:  Nutrition Counseling . Begin exercising-walking with family with goal of 30 minutes 5 times per  week.  Consider swimming. Be mindful when you eat. Slow down. Eat together as a family as much as possible.  No TV or electronics. Great job with making changes about what you drink!  Consider drinking less lemonade and juice. Great job with baking more rather than frying! Less processed food, more whole grains.  Teaching Method Utilized:  Visual Auditory Hands on  Handouts given during visit include:  Meal Plan Card  Label reading  My plate  Exercise ideas for family  Healthy snacks for family  Barriers to learning/adherence to lifestyle change: busy mom  Demonstrated degree of understanding via:  Teach Back   Monitoring/Evaluation:  Dietary intake, exercise, label reading, and body weight in 1 month(s).

## 2014-06-07 ENCOUNTER — Encounter: Payer: Medicaid Other | Attending: Obstetrics & Gynecology | Admitting: Dietician

## 2014-06-07 DIAGNOSIS — Z713 Dietary counseling and surveillance: Secondary | ICD-10-CM | POA: Insufficient documentation

## 2014-06-07 DIAGNOSIS — Z6841 Body Mass Index (BMI) 40.0 and over, adult: Secondary | ICD-10-CM | POA: Diagnosis not present

## 2014-06-07 NOTE — Patient Instructions (Addendum)
Consider increasing exercise to 45 minutes per day! Great job on the changes that you have made!  Cooking method  Snack choices  Exercise  Eating speed and without TV Be mindful when eating

## 2014-06-07 NOTE — Progress Notes (Signed)
  Medical Nutrition Therapy:  Appt start time: 1515 end time:  1430.   Assessment:  Primary concerns today: Help with weight loss.  She has taken phentermine in the past but quit secondary to headaches.  She has recently been drinking more water, and eating more veges and fruits and has not eaten at resteraunts.  Plans to begin walking.  Started making these changes in December.  Weight was 330 lbs.  She is concerned that her back aches a lot and that weight may be contributing.  Patient always has struggled with weight.  She has dark skin on front and back of neck,   Consider checking for PCOS.  Patient is here alone.  She lives with her four children ages 8012, 168, 257 and 2.  Three are girls and also struggle with weight.  Patient does all of the shopping and cooking.  She is a hair stylist.  TANITA  BODY COMP RESULTS (05/16/14)     BMI (kg/m^2) 47   Fat Mass (lbs) 171   Fat Free Mass (lbs) 147.5   Total Body Water (lbs) 108   TANITA  BODY COMP RESULTS (06/07/14)     BMI (kg/m^2) 46.9   Fat Mass (lbs) 165.5   Fat Free Mass (lbs) 152   Total Body Water (lbs) 111.5   Weight trend:  December 330 lbs   05/16/14:  318.5   06/08/11:  317.5 lbs Within the last 3 weeks, patient has increased muscle mass and decreased fat mass.  Weight with little overall change due to fluid but overall very positive improvement in body composition.  Preferred Learning Style:   No preference indicated   Learning Readiness:  Ready  Change in progress  MEDICATIONS: none   DIETARY INTAKE: 24-hr recall:  B ( AM): smaller bowl of raisin bran and 1% milk and fruit or leftovers from dinner and sometimes skips or yogurt or granola Snk ( AM): none  L ( PM): Salad and being mindful of dressing amounts or french fries or fruit or lunchable, or pot pie Snk ( PM): protein granola bar or smoothie made with cucumbers, spinach, and apple D ( PM): meat and 2 veges, Snk ( PM): protein granola bar or fruit Beverages:  water, less juice and rare lemonade milk, green tea (stopped drinking soda), occasional sweet tea  Usual physical activity: has not started due to time (work and children)  Estimated energy needs: 1800 calories 200 g carbohydrates 135 g protein 50 g fat  Progress Towards Goal(s):  In progress.   Nutritional Diagnosis:  NB-1.1 Food and nutrition-related knowledge deficit As related to balance of carbohydrates, protein, and fats.  As evidenced by diet hx..    Intervention:  Nutrition Counseling.  Also discussed benefits of increasing fiber.  Consider increasing exercise to 45 minutes per day! Great job on the changes that you have made!  Cooking method  Snack choices  Exercise  Eating speed and without TV Be mindful when eating  Teaching Method Utilized:  Visual  Auditory  Handouts given during visit include:  [place]ita Body composition results  Will mail sample of PB2  Barriers to learning/adherence to lifestyle change: busy mom  Demonstrated degree of understanding via:  Teach Back   Monitoring/Evaluation:  Dietary intake, exercise, label reading, and body weight in 1 month(s).

## 2014-07-06 ENCOUNTER — Ambulatory Visit: Payer: Medicaid Other | Admitting: Dietician

## 2014-07-27 ENCOUNTER — Encounter (HOSPITAL_COMMUNITY): Payer: Self-pay

## 2014-07-27 ENCOUNTER — Emergency Department (HOSPITAL_COMMUNITY): Payer: Medicaid Other

## 2014-07-27 ENCOUNTER — Emergency Department (HOSPITAL_COMMUNITY)
Admission: EM | Admit: 2014-07-27 | Discharge: 2014-07-27 | Disposition: A | Discharge status: Home / Self Care | Payer: Medicaid Other | Attending: Emergency Medicine | Admitting: Emergency Medicine

## 2014-07-27 DIAGNOSIS — Z79899 Other long term (current) drug therapy: Secondary | ICD-10-CM | POA: Insufficient documentation

## 2014-07-27 DIAGNOSIS — E041 Nontoxic single thyroid nodule: Secondary | ICD-10-CM | POA: Diagnosis not present

## 2014-07-27 DIAGNOSIS — Z9889 Other specified postprocedural states: Secondary | ICD-10-CM | POA: Diagnosis not present

## 2014-07-27 DIAGNOSIS — D649 Anemia, unspecified: Secondary | ICD-10-CM

## 2014-07-27 DIAGNOSIS — R079 Chest pain, unspecified: Secondary | ICD-10-CM | POA: Insufficient documentation

## 2014-07-27 DIAGNOSIS — Z3202 Encounter for pregnancy test, result negative: Secondary | ICD-10-CM | POA: Diagnosis not present

## 2014-07-27 LAB — BASIC METABOLIC PANEL
ANION GAP: 7 (ref 5–15)
BUN: <5 mg/dL — ABNORMAL LOW (ref 6–23)
CO2: 25 mmol/L (ref 19–32)
Calcium: 8.8 mg/dL (ref 8.4–10.5)
Chloride: 105 mmol/L (ref 96–112)
Creatinine, Ser: 0.69 mg/dL (ref 0.50–1.10)
GFR calc non Af Amer: >90 mL/min (ref >90)
Glucose, Bld: 85 mg/dL (ref 70–99)
Potassium: 4 mmol/L (ref 3.5–5.1)
Sodium: 137 mmol/L (ref 135–145)

## 2014-07-27 LAB — CBC
HCT: 35.7 % — ABNORMAL LOW (ref 36.0–46.0)
Hemoglobin: 11.7 g/dL — ABNORMAL LOW (ref 12.0–15.0)
MCH: 29.3 pg (ref 26.0–34.0)
MCHC: 32.8 g/dL (ref 30.0–36.0)
MCV: 89.5 fL (ref 78.0–100.0)
Platelets: 163 10*3/uL (ref 150–400)
RBC: 3.99 MIL/uL (ref 3.87–5.11)
RDW: 14.3 % (ref 11.5–15.5)
WBC: 3.5 10*3/uL — ABNORMAL LOW (ref 4.0–10.5)

## 2014-07-27 LAB — I-STAT TROPONIN, ED: Troponin i, poc: 0 ng/mL (ref 0.00–0.08)

## 2014-07-27 LAB — POC URINE PREG, ED: Preg Test, Ur: NEGATIVE

## 2014-07-27 LAB — D-DIMER, QUANTITATIVE: D-Dimer, Quant: 0.55 ug/mL-FEU — ABNORMAL HIGH (ref 0.00–0.48)

## 2014-07-27 MED ORDER — IOHEXOL 350 MG/ML SOLN
80.0000 mL | Freq: Once | INTRAVENOUS | Status: AC | PRN
  Administered 2014-07-27: 80 mL via INTRAVENOUS

## 2014-07-27 MED ORDER — IBUPROFEN 800 MG PO TABS
800.0000 mg | ORAL_TABLET | Freq: Once | ORAL | Status: AC
  Administered 2014-07-27: 800 mg via ORAL
  Filled 2014-07-27: qty 1

## 2014-07-27 MED ORDER — ALBUTEROL SULFATE (2.5 MG/3ML) 0.083% IN NEBU
2.5000 mg | INHALATION_SOLUTION | Freq: Once | RESPIRATORY_TRACT | Status: AC
  Administered 2014-07-27: 2.5 mg via RESPIRATORY_TRACT
  Filled 2014-07-27: qty 3

## 2014-07-27 NOTE — ED Notes (Signed)
Patient transported to CT 

## 2014-07-27 NOTE — ED Notes (Signed)
Pt reports mid chest pain and a headache for the past two days that feels like a pressure. Feels a little nauseated. States she has been coughing up cold.

## 2014-07-27 NOTE — Discharge Instructions (Signed)
Chest Pain (Nonspecific) [facility] with [facility].  You have a 2.1 cm right thyroid nodule.  You also have anemia with a hgb of 11.7. Take tylenol or motrin for pain.  It is often hard to give a specific diagnosis for the cause of chest pain. There is always a chance that your pain could be related to something serious, such as a heart attack or a blood clot in the lungs. You need to follow up with your health care provider for further evaluation. CAUSES   Heartburn.  Pneumonia or bronchitis.  Anxiety or stress.  Inflammation around your heart (pericarditis) or lung (pleuritis or pleurisy).  A blood clot in the lung.  A collapsed lung (pneumothorax). It can develop suddenly on its own (spontaneous pneumothorax) or from trauma to the chest.  Shingles infection (herpes zoster virus). The chest wall is composed of bones, muscles, and cartilage. Any of these can be the source of the pain.  The bones can be bruised by injury.  The muscles or cartilage can be strained by coughing or overwork.  The cartilage can be affected by inflammation and become sore (costochondritis). DIAGNOSIS  Lab tests or other studies may be needed to find the cause of your pain. Your health care provider may have you take a test called an ambulatory electrocardiogram (ECG). An ECG records your heartbeat patterns over a 24-hour period. You may also have other tests, such as:  Transthoracic echocardiogram (TTE). During echocardiography, sound waves are used to evaluate how blood flows through your heart.  Transesophageal echocardiogram (TEE).  Cardiac monitoring. This allows your health care provider to monitor your heart rate and rhythm in real time.  Holter monitor. This is a portable device that records your heartbeat and can help diagnose heart arrhythmias. It allows your health care provider to track your heart activity for several days, if needed.  Stress tests by exercise or by giving  medicine that makes the heart beat faster. TREATMENT   Treatment depends on what may be causing your chest pain. Treatment may include:  Acid blockers for heartburn.  Anti-inflammatory medicine.  Pain medicine for inflammatory conditions.  Antibiotics if an infection is present.  You may be advised to change lifestyle habits. This includes stopping smoking and avoiding alcohol, caffeine, and chocolate.  You may be advised to keep your head raised (elevated) when sleeping. This reduces the chance of acid going backward from your stomach into your esophagus. Most of the time, nonspecific chest pain will improve within 2-3 days with rest and mild pain medicine.  HOME CARE INSTRUCTIONS   If antibiotics were prescribed, take them as directed. Finish them even if you start to feel better.  For the next few days, avoid physical activities that bring on chest pain. Continue physical activities as directed.  Do not use any tobacco products, including cigarettes, chewing tobacco, or electronic cigarettes.  Avoid drinking alcohol.  Only take medicine as directed by your health care provider.  Follow your health care provider's suggestions for further testing if your chest pain does not go away.  Keep any follow-up appointments you made. If you do not go to an appointment, you could develop lasting (chronic) problems with pain. If there is any problem keeping an appointment, call to reschedule. SEEK MEDICAL CARE IF:   Your chest pain does not go away, even after treatment.  You have a rash with blisters on your chest.  You have a fever. SEEK IMMEDIATE MEDICAL CARE IF:  You have increased chest pain or pain that spreads to your arm, neck, jaw, back, or abdomen.  You have shortness of breath.  You have an increasing cough, or you cough up blood.  You have severe back or abdominal pain.  You feel nauseous or vomit.  You have severe weakness.  You faint.  You have  chills. This is an emergency. Do not wait to see if the pain will go away. Get medical help at once. Call your local emergency services (911 in [place] Do not drive yourself to the hospital. MAKE SURE YOU:   Understand these instructions.  Will watch your condition.  Will get help right away if you are not doing well or get worse. Document Released: 01/10/2005 Document Revised: 04/07/2013 Document Reviewed: 11/06/2007 [facility] Patient Information 2015 [place], [place]. This information is not intended to replace advice given to you by your health care provider. Make sure you discuss any questions you have with your health care provider.

## 2014-07-27 NOTE — ED Provider Notes (Signed)
CSN: [id number]     Arrival date & time 07/27/14  1408 History   First MD Initiated Contact with Patient 07/27/14 1729     Chief Complaint  Patient presents with  . Chest Pain  . Headache     (Consider location/radiation/quality/duration/timing/severity/associated sxs/prior Treatment) Patient is a 29 y.o. female presenting with chest pain and headaches. The history is provided by the patient. No language interpreter was used.  Chest Pain Associated symptoms: headache   Headache [name] is a 29 y.o black female who presents with new onset, constant chest pain and shortness of breath that began yesterday. She describes it as a tightness. The pain does not radiate. She took tylenol without relieve.  Nothing makes it worse.  10/10 pain. She had a cough recently but it has since resolved.  She is on birth control.  She denies any fever, chills, lightheadedness, abdominal pain, nausea, vomiting, urinary symptoms or leg swelling. LMP 3 weeks ago.   Past Medical History  Diagnosis Date  . No pertinent past medical history   . S/P cesarean section 08/26/2011   Past Surgical History  Procedure Laterality Date  . Cesarean section      Breech, 2nd FTD  . Cesarean section  08/23/2011    Procedure: CESAREAN SECTION;  Surgeon: Bing Plume, MD;  Location: [facility];  Service: Gynecology;  Laterality: N/A;  Repeat   No family history on file. History  Substance Use Topics  . Smoking status: Never Smoker   . Smokeless tobacco: Never Used  . Alcohol Use: No   OB History    Gravida Para Term Preterm AB TAB SAB Ectopic Multiple Living   Review of Systems  Cardiovascular: Positive for chest pain.  Neurological: Positive for headaches.  All other systems reviewed and are negative.     Allergies  Peanut-containing drug products  Home Medications   Prior to Admission medications   Medication Sig Start Date End Date Taking? Authorizing Provider   Phenylephrine-Acetaminophen (TYLENOL SINUS CONGESTION/PAIN PO) Take 2 tablets by mouth 2 (two) times daily.   Yes Historical Provider, MD   BP 110/68 mmHg  Pulse 77  Temp(Src) 99.1 F (37.3 C) (Oral)  Resp 14  Ht  (1.753 m)  Wt 290 lb (131.543 kg)  BMI 42.81 kg/m2  SpO2 100%  LMP 06/25/2014 Physical Exam  Constitutional: She is oriented to person, place, and time. She appears well-developed and well-nourished.  HENT:  Head: Normocephalic and atraumatic.  Eyes: Conjunctivae and EOM are normal.  Neck: Normal range of motion. Neck supple.  Cardiovascular: Normal rate, regular rhythm and normal heart sounds.   Pulmonary/Chest: Effort normal and breath sounds normal. No respiratory distress. She has no wheezes. She has no rales. She exhibits no tenderness.  Abdominal: Soft. There is no tenderness.  Musculoskeletal: Normal range of motion.  Neurological: She is alert and oriented to person, place, and time.  Skin: Skin is warm and dry.  Nursing note and vitals reviewed.   ED Course  Procedures (including critical care time) Labs Review Labs Reviewed  CBC - Abnormal; Notable for the following:    WBC 3.5 (*)    Hemoglobin 11.7 (*)    HCT 35.7 (*)    All other components within normal limits  BASIC METABOLIC PANEL - Abnormal; Notable for the following:    BUN <5 (*)    All other components within normal limits  D-DIMER, QUANTITATIVE - Abnormal; Notable for the following:    D-Dimer, Quant 0.55 (*)    All other components within normal limits  I-STAT TROPOININ, ED  POC URINE PREG, ED    Imaging Review Dg Chest 2 View  07/27/2014   CLINICAL DATA:  Right side chest pain, shortness of breath, cough, headache starting yesterday  EXAM: CHEST  2 VIEW  COMPARISON:  05/21/2004  FINDINGS: Cardiomediastinal silhouette is stable. No acute infiltrate or pleural effusion. No pulmonary edema. Stable mild compression deformity mid thoracic spine.  IMPRESSION: No active cardiopulmonary  disease.   Electronically Signed   By: Natasha Mead M.D.   On: 07/27/2014 15:07   Ct Angio Chest Pe W/cm &/or Wo Cm  07/27/2014   CLINICAL DATA:  Productive cough, shortness of breath, mid chest pain.  EXAM: CT ANGIOGRAPHY CHEST WITH CONTRAST  TECHNIQUE: Multidetector CT imaging of the chest was performed using the standard protocol during bolus administration of intravenous contrast. Multiplanar CT image reconstructions and MIPs were obtained to evaluate the vascular anatomy.  CONTRAST:  80mL OMNIPAQUE IOHEXOL 350 MG/ML SOLN  COMPARISON:  None.  FINDINGS: No filling defects in the pulmonary arteries to suggest pulmonary emboli. Heart is normal size. Aorta is normal caliber. No mediastinal, hilar, or axillary adenopathy. Lungs are clear. No focal airspace opacities or suspicious nodules. No effusions.  2.1 cm low-density nodule in the right thyroid lobe. Chest wall soft tissues are unremarkable. Imaging into the upper abdomen shows no acute findings. No acute bony abnormality.  Review of the MIP images confirms the above findings.  IMPRESSION: No evidence of pulmonary embolus.  No acute cardiopulmonary disease.  2.1 cm right thyroid nodule.   Electronically Signed   By: Charlett Nose M.D.   On: 07/27/2014 20:38    ED ECG REPORT   Date: 07/28/2014  Rate: 93  Rhythm: Normal Sinus  QRS Duration: 72  PR Interval: 164  P-R-T axes: 63 53 16  QT/QTc: 332/412  Narrative Interpretation: Normal sinus rhythm at 93 BPM  I have personally reviewed the EKG tracing and agree with the computerized printout as noted.  MDM   Final diagnoses:  Chest pain, unspecified chest pain type  Thyroid nodule  Anemia, unspecified anemia type  [name] is a 29 y.o black female who presents with new onset, constant chest pain and shortness of breath that began yesterday. She describes it as a tightness. The pain does not radiate. She took tylenol without relieve.  Nothing makes it worse.  10/10 pain. She had a cough recently  but it has since resolved.  She is on birth control.  Her LMP was 3 weeks ago. She has no history of asthma. Her exam was unremarkable.  She is sitting comfortably in no acute distress.  No shortness of breath.  I do not think this is cardiac in nature.  I reviewed the heart score and she is at low risk for major cardiac event. Her vitals are stable and she is not tachycardic. Her EKG is normal and troponin is negative. CXR shows no pneumonia, no pleural effusion, and no pneumothorax.  I did give her a breathing treatment while she was in the ED.  She is PERC positive due to birth control. Her CTA showed no evidence of PE or acute pulmonary disease. She did have an incidental 2.1 cm right thyroid nodule.  This is most likely related to URI symptoms from a recent cough and sore throat. I discussed anemia and thyroid nodule  with the patient.  I have given her follow up with  and wellness.  She agrees with the plan. She can take tylenol or motrin for pain.       Catha GosselinHanna Patel-Mills, PA-C 07/28/14 1145  Nelva Nayobert Beaton, MD 07/30/14 [phone]

## 2015-03-28 ENCOUNTER — Encounter (HOSPITAL_COMMUNITY): Payer: Self-pay | Admitting: *Deleted

## 2015-03-28 ENCOUNTER — Emergency Department (INDEPENDENT_AMBULATORY_CARE_PROVIDER_SITE_OTHER)
Admission: EM | Admit: 2015-03-28 | Discharge: 2015-03-28 | Disposition: A | Discharge status: Home / Self Care | Payer: Medicaid Other | Source: Home / Self Care | Attending: Family Medicine | Admitting: Family Medicine

## 2015-03-28 DIAGNOSIS — B86 Scabies: Secondary | ICD-10-CM

## 2015-03-28 MED ORDER — PERMETHRIN 5 % EX CREA: TOPICAL_CREAM | CUTANEOUS | Status: DC

## 2015-03-28 NOTE — ED Notes (Signed)
Rash    X  3  Weeks -   pts  Daughter  Has  A  Similar  Rash           no  Angioedema      No  Known  Causative  Agent

## 2015-03-28 NOTE — ED Provider Notes (Signed)
CSN: [id number]     Arrival date & time 03/28/15  1923 History   First MD Initiated Contact with Patient 03/28/15 2001     Chief Complaint  Patient presents with  . Rash   (Consider location/radiation/quality/duration/timing/severity/associated sxs/prior Treatment) Patient is a 29 y.o. female presenting with rash. The history is provided by the patient.  Rash Location:  Full body Quality: itchiness   Quality: not painful   Severity:  Moderate Onset quality:  Gradual Duration:  3 weeks Progression:  Spreading Chronicity:  New Context comment:  Other family members with same. Relieved by:  None tried Worsened by:  Nothing tried Associated symptoms: no abdominal pain and no fever     Past Medical History  Diagnosis Date  . No pertinent past medical history   . S/P cesarean section 08/26/2011   Past Surgical History  Procedure Laterality Date  . Cesarean section      Breech, 2nd FTD  . Cesarean section  08/23/2011    Procedure: CESAREAN SECTION;  Surgeon: Bing Plumehomas F Henley, MD;  Location: [facility];  Service: Gynecology;  Laterality: N/A;  Repeat   History reviewed. No pertinent family history. Social History  Substance Use Topics  . Smoking status: Never Smoker   . Smokeless tobacco: Never Used  . Alcohol Use: No   OB History    Gravida Para Term Preterm AB TAB SAB Ectopic Multiple Living   4 4 4       4      Review of Systems  Constitutional: Negative.  Negative for fever.  Gastrointestinal: Negative for abdominal pain.  Musculoskeletal: Negative.   Skin: Positive for rash.  All other systems reviewed and are negative.   Allergies  Peanut-containing drug products  Home Medications   Prior to Admission medications   Medication Sig Start Date End Date Taking? Authorizing Provider  permethrin (ELIMITE) 5 % cream Use as directed on package, repeat in 1 week. 03/28/15   Linna HoffJames D Philena Obey, MD  Phenylephrine-Acetaminophen (TYLENOL SINUS CONGESTION/PAIN PO) Take 2 tablets by  mouth 2 (two) times daily.    Historical Provider, MD   Meds Ordered and Administered this Visit  Medications - No data to display  BP 109/72 mmHg  Pulse 99  Temp(Src) 98.9 F (37.2 C) (Oral)  Resp 16  SpO2 98%  LMP 03/28/2015 No data found.   Physical Exam  Constitutional: She is oriented to person, place, and time. She appears well-developed and well-nourished.  Neurological: She is alert and oriented to person, place, and time.  Skin: Skin is warm and dry. Rash noted.  Fine diffuse papular pruritic excoriated rash, mostly nondescript, poorly seen.  Nursing note and vitals reviewed.   ED Course  Procedures (including critical care time)  Labs Review Labs Reviewed - No data to display  Imaging Review No results found.   Visual Acuity Review  Right Eye Distance:   Left Eye Distance:   Bilateral Distance:    Right Eye Near:   Left Eye Near:    Bilateral Near:         MDM   1. Scabies infestation        Linna HoffJames D Marjarie Irion, MD 03/28/15 2034

## 2015-04-19 ENCOUNTER — Emergency Department (HOSPITAL_COMMUNITY): Payer: Medicaid Other

## 2015-04-19 ENCOUNTER — Encounter (HOSPITAL_COMMUNITY): Payer: Self-pay | Admitting: Emergency Medicine

## 2015-04-19 ENCOUNTER — Emergency Department (HOSPITAL_COMMUNITY)
Admission: EM | Admit: 2015-04-19 | Discharge: 2015-04-19 | Disposition: A | Discharge status: Home / Self Care | Payer: Medicaid Other | Attending: Emergency Medicine | Admitting: Emergency Medicine

## 2015-04-19 DIAGNOSIS — S4992XA Unspecified injury of left shoulder and upper arm, initial encounter: Secondary | ICD-10-CM | POA: Insufficient documentation

## 2015-04-19 DIAGNOSIS — Y9389 Activity, other specified: Secondary | ICD-10-CM | POA: Diagnosis not present

## 2015-04-19 DIAGNOSIS — S60811A Abrasion of right wrist, initial encounter: Secondary | ICD-10-CM | POA: Diagnosis not present

## 2015-04-19 DIAGNOSIS — S29001A Unspecified injury of muscle and tendon of front wall of thorax, initial encounter: Secondary | ICD-10-CM | POA: Insufficient documentation

## 2015-04-19 DIAGNOSIS — T148XXA Other injury of unspecified body region, initial encounter: Secondary | ICD-10-CM

## 2015-04-19 DIAGNOSIS — S60211A Contusion of right wrist, initial encounter: Secondary | ICD-10-CM | POA: Diagnosis not present

## 2015-04-19 DIAGNOSIS — T07XXXA Unspecified multiple injuries, initial encounter: Secondary | ICD-10-CM

## 2015-04-19 DIAGNOSIS — S8991XA Unspecified injury of right lower leg, initial encounter: Secondary | ICD-10-CM | POA: Diagnosis not present

## 2015-04-19 DIAGNOSIS — M7989 Other specified soft tissue disorders: Secondary | ICD-10-CM

## 2015-04-19 DIAGNOSIS — Y998 Other external cause status: Secondary | ICD-10-CM | POA: Insufficient documentation

## 2015-04-19 DIAGNOSIS — Y92410 Unspecified street and highway as the place of occurrence of the external cause: Secondary | ICD-10-CM | POA: Insufficient documentation

## 2015-04-19 DIAGNOSIS — S8992XA Unspecified injury of left lower leg, initial encounter: Secondary | ICD-10-CM | POA: Diagnosis not present

## 2015-04-19 DIAGNOSIS — S6991XA Unspecified injury of right wrist, hand and finger(s), initial encounter: Secondary | ICD-10-CM | POA: Diagnosis present

## 2015-04-19 DIAGNOSIS — S59911A Unspecified injury of right forearm, initial encounter: Secondary | ICD-10-CM | POA: Insufficient documentation

## 2015-04-19 DIAGNOSIS — Z79899 Other long term (current) drug therapy: Secondary | ICD-10-CM | POA: Insufficient documentation

## 2015-04-19 DIAGNOSIS — M79631 Pain in right forearm: Secondary | ICD-10-CM

## 2015-04-19 MED ORDER — METHOCARBAMOL 500 MG PO TABS
500.0000 mg | ORAL_TABLET | Freq: Once | ORAL | Status: AC
  Administered 2015-04-19: 500 mg via ORAL
  Filled 2015-04-19: qty 1

## 2015-04-19 MED ORDER — NAPROXEN 500 MG PO TABS: 500.0000 mg | ORAL_TABLET | Freq: Two times a day (BID) | ORAL | Status: DC

## 2015-04-19 MED ORDER — NAPROXEN 500 MG PO TABS
500.0000 mg | ORAL_TABLET | Freq: Once | ORAL | Status: AC
  Administered 2015-04-19: 500 mg via ORAL
  Filled 2015-04-19: qty 1

## 2015-04-19 MED ORDER — METHOCARBAMOL 500 MG PO TABS: 500.0000 mg | ORAL_TABLET | Freq: Two times a day (BID) | ORAL | Status: DC

## 2015-04-19 NOTE — ED Notes (Signed)
Pt to xray
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
## 2015-05-25 ENCOUNTER — Emergency Department (HOSPITAL_COMMUNITY)
Admission: EM | Admit: 2015-05-25 | Discharge: 2015-05-25 | Disposition: A | Discharge status: Home / Self Care | Payer: Medicaid Other | Attending: Emergency Medicine | Admitting: Emergency Medicine

## 2015-05-25 ENCOUNTER — Encounter (HOSPITAL_COMMUNITY): Payer: Self-pay

## 2015-05-25 DIAGNOSIS — S63501D Unspecified sprain of right wrist, subsequent encounter: Secondary | ICD-10-CM

## 2015-05-25 DIAGNOSIS — Z791 Long term (current) use of non-steroidal anti-inflammatories (NSAID): Secondary | ICD-10-CM | POA: Insufficient documentation

## 2015-05-25 DIAGNOSIS — Z79899 Other long term (current) drug therapy: Secondary | ICD-10-CM | POA: Insufficient documentation

## 2015-05-25 DIAGNOSIS — M25511 Pain in right shoulder: Secondary | ICD-10-CM

## 2015-05-25 MED ORDER — TRAMADOL HCL 50 MG PO TABS: 50.0000 mg | ORAL_TABLET | Freq: Two times a day (BID) | ORAL | Status: DC | PRN

## 2015-05-25 MED ORDER — CYCLOBENZAPRINE HCL 10 MG PO TABS: 10.0000 mg | ORAL_TABLET | Freq: Every evening | ORAL | Status: DC | PRN
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
## 2015-05-25 NOTE — Discharge Instructions (Signed)
Cryotherapy Cryotherapy is when you put ice on your injury. Ice helps lessen pain and puffiness (swelling) after an injury. Ice works the best when you start using it in the first 24 to 48 hours after an injury. HOME CARE  Put a dry or damp towel between the ice pack and your skin.  You may press gently on the ice pack.  Leave the ice on for no more than 10 to 20 minutes at a time.  Check your skin after 5 minutes to make sure your skin is okay.  Rest at least 20 minutes between ice pack uses.  Stop using ice when your skin loses feeling (numbness).  Do not use ice on someone who cannot tell you when it hurts. This includes small children and people with memory problems (dementia). GET HELP RIGHT AWAY IF:  You have white spots on your skin.  Your skin turns blue or pale.  Your skin feels waxy or hard.  Your puffiness gets worse. MAKE SURE YOU:   Understand these instructions.  Will watch your condition.  Will get help right away if you are not doing well or get worse.   This information is not intended to replace advice given to you by your health care provider. Make sure you discuss any questions you have with your health care provider.   Document Released: 09/19/2007 Document Revised: 06/25/2011 Document Reviewed: 11/23/2010 Elsevier Interactive Patient Education 2016 [employer] Inc[employer]  Shoulder Pain The shoulder is the joint that connects your arms to your body. The bones that form the shoulder joint include the upper arm bone (humerus), the shoulder blade (scapula), and the collarbone (clavicle). The top of the humerus is shaped like a ball and fits into a rather flat socket on the scapula (glenoid cavity). A combination of muscles and strong, fibrous tissues that connect muscles to bones (tendons) support your shoulder joint and hold the ball in the socket. Small, fluid-filled sacs (bursae) are located in different areas of the joint. They act as cushions between the bones  and the overlying soft tissues and help reduce friction between the gliding tendons and the bone as you move your arm. Your shoulder joint allows a wide range of motion in your arm. This range of motion allows you to do things like scratch your back or throw a ball. However, this range of motion also makes your shoulder more prone to pain from overuse and injury. Causes of shoulder pain can originate from both injury and overuse and usually can be grouped in the following four categories:  Redness, swelling, and pain (inflammation) of the tendon (tendinitis) or the bursae (bursitis).  Instability, such as a dislocation of the joint.  Inflammation of the joint (arthritis).  Broken bone (fracture). HOME CARE INSTRUCTIONS   Apply ice to the sore area.  Put ice in a plastic bag.  Place a towel between your skin and the bag.  Leave the ice on for 15-20 minutes, 3-4 times per day for the first 2 days, or as directed by your health care provider.  Stop using cold packs if they do not help with the pain.  If you have a shoulder sling or immobilizer, wear it as long as your caregiver instructs. Only remove it to shower or bathe. Move your arm as little as possible, but keep your hand moving to prevent swelling.  Squeeze a soft ball or foam pad as much as possible to help prevent swelling.  Only take over-the-counter or prescription medicines for  pain, discomfort, or fever as directed by your caregiver. SEEK MEDICAL CARE IF:   Your shoulder pain increases, or new pain develops in your arm, hand, or fingers.  Your hand or fingers become cold and numb.  Your pain is not relieved with medicines. SEEK IMMEDIATE MEDICAL CARE IF:   Your arm, hand, or fingers are numb or tingling.  Your arm, hand, or fingers are significantly swollen or turn white or blue. MAKE SURE YOU:   Understand these instructions.  Will watch your condition.  Will get help right away if you are not doing well or get  worse.   This information is not intended to replace advice given to you by your health care provider. Make sure you discuss any questions you have with your health care provider.  Keep appointment with your orthopedic provider for next week. Apply ice to affected area. Continue wearing arm brace. Return to the emergency department if you experience severe worsening of your symptoms, numbness or tingling in your extremity, swelling of your extremity, redness or warmth of your joint, fever, chills.
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
## 2015-06-02 ENCOUNTER — Ambulatory Visit: Payer: Medicaid Other | Attending: Orthopaedic Surgery | Admitting: Physical Therapy

## 2015-06-02 DIAGNOSIS — M6281 Muscle weakness (generalized): Secondary | ICD-10-CM | POA: Diagnosis present

## 2015-06-02 DIAGNOSIS — R531 Weakness: Secondary | ICD-10-CM

## 2015-06-02 DIAGNOSIS — S161XXA Strain of muscle, fascia and tendon at neck level, initial encounter: Secondary | ICD-10-CM | POA: Insufficient documentation

## 2015-06-02 DIAGNOSIS — M542 Cervicalgia: Secondary | ICD-10-CM

## 2015-06-02 DIAGNOSIS — M2560 Stiffness of unspecified joint, not elsewhere classified: Secondary | ICD-10-CM | POA: Insufficient documentation

## 2015-06-02 DIAGNOSIS — X58XXXA Exposure to other specified factors, initial encounter: Secondary | ICD-10-CM | POA: Diagnosis not present
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
## 2015-06-13 ENCOUNTER — Ambulatory Visit: Payer: Medicaid Other | Admitting: Physical Therapy

## 2015-06-15 ENCOUNTER — Ambulatory Visit: Payer: Medicaid Other | Attending: Orthopaedic Surgery

## 2015-06-15 DIAGNOSIS — M2560 Stiffness of unspecified joint, not elsewhere classified: Secondary | ICD-10-CM

## 2015-06-15 DIAGNOSIS — S161XXA Strain of muscle, fascia and tendon at neck level, initial encounter: Secondary | ICD-10-CM | POA: Diagnosis present

## 2015-06-15 DIAGNOSIS — X58XXXA Exposure to other specified factors, initial encounter: Secondary | ICD-10-CM | POA: Diagnosis not present

## 2015-06-15 DIAGNOSIS — M542 Cervicalgia: Secondary | ICD-10-CM | POA: Diagnosis present

## 2015-06-15 DIAGNOSIS — M6281 Muscle weakness (generalized): Secondary | ICD-10-CM | POA: Insufficient documentation

## 2015-06-15 NOTE — Therapy (Signed)
[facility] Outpatient Rehabilitation [facility] [address] [place], [place], [address] Phone: [phone]   Fax:  [phone]  Physical Therapy Treatment  Patient Details  Name: [name] MRN: [id number] Date of Birth: 03-20-1986 Referring Provider: Marcene Corning  Encounter Date: 06/15/2015      PT End of Session - 06/15/15 1226    Visit Number 2   Number of Visits 12   Date for PT Re-Evaluation 07/28/15   PT Start Time 1137   PT Stop Time 1235   PT Time Calculation (min) 58 min   Activity Tolerance Patient tolerated treatment well   Behavior During Therapy [facility] for tasks assessed/performed      Past Medical History  Diagnosis Date  . No pertinent past medical history   . S/P cesarean section 08/26/2011    Past Surgical History  Procedure Laterality Date  . Cesarean section      Breech, 2nd FTD  . Cesarean section  08/23/2011    Procedure: CESAREAN SECTION;  Surgeon: Bing Plume, MD;  Location: [facility];  Service: Gynecology;  Laterality: N/A;  Repeat    There were no vitals filed for this visit.  Visit Diagnosis:  Cervical strain, initial encounter  Neck pain  Range of motion deficit      Subjective Assessment - 06/15/15 1150    Subjective She reports pain some better but still hurting, worse after work   Currently in Pain? Yes   Pain Score 5    Pain Location Neck   Pain Orientation Right   Pain Descriptors / Indicators Pressure   Pain Type Acute pain   Pain Onset More than a month ago   Pain Frequency Constant   Multiple Pain Sites No                         [facility] Adult PT Treatment/Exercise - 06/15/15 1153    Modalities   Modalities Ultrasound;Moist Heat   Moist Heat Therapy   Number Minutes Moist Heat 14 Minutes   Moist Heat Location Cervical   Ultrasound   Ultrasound Location RT neck /raps   Ultrasound Parameters 100% 1.5 Wcm2   Ultrasound Goals Pain   Manual Therapy   Manual Therapy Soft  tissue mobilization;Taping   Soft tissue mobilization with rockblade to rt paraspinals/levatora dn trap/scalene.  followed by Gr 2/3 PA glides RT cervical spine    Kinesiotex Inhibit Muscle   Kinesiotix   Inhibit Muscle  Tape to RT trap and levator RT  with 0% pull   Neck Exercises: Stretches   Neck Stretch 4 reps;10 seconds  sidebend RT    Reviewed all HEP. She was independent after hold  time  cues              PT Short Term Goals - 06/15/15 1228    PT SHORT TERM GOAL #1   Title Patient to demonstrate independence with initial HEP.    Status Achieved   PT SHORT TERM GOAL #2   Title Patient to demonstrate 25 degrees of cervical flexion for reading tasks.    Status Unable to assess   PT SHORT TERM GOAL #3   Title Patient to rate her pain a less than or equal to 3/10 for ADL tolerance   Status On-going           PT Long Term Goals - 06/02/15 1035    PT LONG TERM GOAL #1   Title Patient to demonstrate  independence with advanced HEP for continuation after D/C   Time 6   Period Weeks   Status New   PT LONG TERM GOAL #2   Title Patient to demo 50 degrees of cervical rotation rt for driving tasks.    Time 6   Period Weeks   Status New   PT LONG TERM GOAL #3   Title Patient to demo 45 degrees cervical flexion for reading/writing tasks.   Time 6   Period Weeks   Status New   PT LONG TERM GOAL #4   Title Patient to rate her pain as 0/10 at rest.    Time 6   Period Weeks   Status New   PT LONG TERM GOAL #5   Title Patient to report ability to return to work.    Time 6   Period Weeks               Plan - 06/15/15 1227    Clinical Impression Statement she continues with pain and tenderness RT neck . Modalities helped and she needed cues to stretch longer for sidebend stretch.    PT Next Visit Plan modalities , manual , measure ROM, stab exercises and levator stretching   Consulted and Agree with Plan of Care Patient        Problem List Patient  Active Problem List   Diagnosis Date Noted  . S/P cesarean section 08/26/2011    Caprice Red PT 06/15/2015, 12:29 PM  [facility] Outpatient Rehabilitation [facility] [address] [place], [place], [address] Phone: [phone]   Fax:  [phone]  Name: [name] MRN: [id number] Date of Birth: 19-Nov-1985

## 2015-06-20 ENCOUNTER — Ambulatory Visit: Payer: Medicaid Other | Admitting: Physical Therapy

## 2015-06-20 DIAGNOSIS — S161XXA Strain of muscle, fascia and tendon at neck level, initial encounter: Secondary | ICD-10-CM | POA: Diagnosis not present

## 2015-06-20 DIAGNOSIS — M542 Cervicalgia: Secondary | ICD-10-CM

## 2015-06-20 DIAGNOSIS — R531 Weakness: Secondary | ICD-10-CM

## 2015-06-20 DIAGNOSIS — M2560 Stiffness of unspecified joint, not elsewhere classified: Secondary | ICD-10-CM

## 2015-06-20 NOTE — Therapy (Signed)
[facility] [address] [place], [place], [address] Phone: [phone]   Fax:  [phone]  Physical Therapy Treatment  Patient Details  Name: [name] MRN: [id number] Date of Birth: July 28, 1985 Referring Provider: Marcene CorningPeter Dalldorf  Encounter Date: 06/20/2015      PT End of Session - 06/20/15 1419    Visit Number 3   Number of Visits 12   Date for PT Re-Evaluation 07/28/15   PT Start Time 1106   PT Stop Time 1146   PT Time Calculation (min) 40 min   Activity Tolerance Patient tolerated treatment well   Behavior [facility] for tasks assessed/performed      Past Medical History  Diagnosis Date  . No pertinent past medical history   . S/P cesarean section 08/26/2011    Past Surgical History  Procedure Laterality Date  . Cesarean section      Breech, 2nd FTD  . Cesarean section  08/23/2011    Procedure: CESAREAN SECTION;  Surgeon: Bing Plumehomas F Henley, MD;  Location: [facility];  Service: Gynecology;  Laterality: N/A;  Repeat    There were no vitals filed for this visit.  Visit Diagnosis:  Cervical strain, initial encounter  Neck pain  Range of motion deficit  Decreased strength      Subjective Assessment - 06/20/15 1116    Subjective I don't have pain all the time now.  Motion improving.  I'm doing my exercises.  Wakes 2 X a night with pain.     Currently in Pain? Yes   Pain Score 4    Pain Location Neck   Pain Orientation Right   Pain Descriptors / Indicators Pressure   Pain Type Acute pain   Pain Radiating Towards collar bone   Pain Frequency Intermittent   Aggravating Factors  moving , sleeping on RT side   Pain Relieving Factors Medication   Multiple Pain Sites Yes   Pain Score 8   Pain Location Wrist   Pain Orientation Right;Lateral;Anterior   Pain Descriptors / Indicators Aching;Burning;Throbbing   Pain Frequency Constant   Aggravating Factors  using   Pain Relieving Factors brack,  medication            [facility] PT Assessment - 06/20/15 0001    AROM   Cervical - Right Side Bend 25   Cervical - Left Side Bend 20   Cervical - Right Rotation 70   Cervical - Left Rotation 60                     [facility] Adult PT Treatment/Exercise - 06/20/15 1107    Self-Care   Self-Care --  sitting posture, lumbar roll pillows for arms.  practiced   Neck Exercises: Seated   Neck Retraction 5 reps;5 secs   Cervical Rotation --  2 reps 10 seconds   Lateral Flexion Limitations 2 reps, 5 seconds   Other Seated Exercise deep neck flexor strengthening.   Manual Therapy   Soft tissue mobilization RT>LT multiple tight areas light pressure,  instrument assist intermittantly  .  Patient noted symptoms down arm with some manual RT to deltiod.    Neck Exercises: Stretches   Upper Trapezius Stretch 1 rep;10 seconds  with strumming for muscle lengthening   Levator Stretch 1 rep;10 seconds  with strumming for miscle lengthening   Other Neck Stretches Neck flexion stretches with and without fist to rest head 2 reps 10 seconde withstrumming for muscle lengthening.  PT Education - 06/20/15 1418    Education provided Yes   Education Details sitting posture,  position changes frequently   Person(s) Educated Patient   Methods Explanation;Verbal cues   Comprehension Verbalized understanding;Returned demonstration          PT Short Term Goals - 06/20/15 1422    PT SHORT TERM GOAL #1   Title Patient to demonstrate independence with initial HEP.    Time 2   Period Weeks   Status Achieved   PT SHORT TERM GOAL #2   Title Patient to demonstrate 25 degrees of cervical flexion for reading tasks.    Time 2   Period Weeks   Status Unable to assess   PT SHORT TERM GOAL #3   Title Patient to rate her pain a less than or equal to 3/10 for ADL tolerance   Baseline 4/10   Time 2   Period Weeks   Status On-going           PT Long Term Goals - 06/02/15  1035    PT LONG TERM GOAL #1   Title Patient to demonstrate independence with advanced HEP for continuation after D/C   Time 6   Period Weeks   Status New   PT LONG TERM GOAL #2   Title Patient to demo 50 degrees of cervical rotation rt for driving tasks.    Time 6   Period Weeks   Status New   PT LONG TERM GOAL #3   Title Patient to demo 45 degrees cervical flexion for reading/writing tasks.   Time 6   Period Weeks   Status New   PT LONG TERM GOAL #4   Title Patient to rate her pain as 0/10 at rest.    Time 6   Period Weeks   Status New   PT LONG TERM GOAL #5   Title Patient to report ability to return to work.    Time 6   Period Weeks               Plan - 06/20/15 1420    Clinical Impression Statement ROM and pain continue to improve.  Patrient is working with modifications and frequent rests.  Posture improves with lumbar support.  Pain RT wrist 7-8/10 (Not treating)   PT Next Visit Plan modalities , manual , , stab exercises and levator stretching   PT Home Exercise Plan continuew   Consulted and Agree with Plan of Care Patient        Problem List Patient Active Problem List   Diagnosis Date Noted  . S/P cesarean section 08/26/2011    [facility] 06/20/2015, 2:26 PM  [facility] [address] [place], [place], [address] Phone: [phone]   Fax:  [phone]  Name: [name] MRN: [id number] Date of Birth: [birth year]    Liz Beach, PTA 06/20/2015 2:26 PM Phone: [phone] Fax: [phone]

## 2015-06-22 ENCOUNTER — Ambulatory Visit: Payer: Medicaid Other

## 2015-06-22 DIAGNOSIS — R531 Weakness: Secondary | ICD-10-CM

## 2015-06-22 DIAGNOSIS — M542 Cervicalgia: Secondary | ICD-10-CM

## 2015-06-22 DIAGNOSIS — S161XXA Strain of muscle, fascia and tendon at neck level, initial encounter: Secondary | ICD-10-CM | POA: Diagnosis not present

## 2015-06-22 DIAGNOSIS — M2560 Stiffness of unspecified joint, not elsewhere classified: Secondary | ICD-10-CM

## 2015-06-22 NOTE — Therapy (Signed)
[facility] Outpatient Rehabilitation [facility] [address] [place], [place], [address] Phone: [phone]   Fax:  [phone]  Physical Therapy Treatment  Patient Details  Name: BABYGIRL TRAGER MRN: [id number] Date of Birth: 1985/12/23 Referring Provider: Marcene Corning  Encounter Date: 06/22/2015      PT End of Session - 06/22/15 1139    Visit Number 4   Number of Visits 12   Date for PT Re-Evaluation 07/28/15   PT Start Time 1145   PT Stop Time 1235   PT Time Calculation (min) 50 min   Activity Tolerance Patient tolerated treatment well   Behavior During Therapy [facility] for tasks assessed/performed      Past Medical History  Diagnosis Date  . No pertinent past medical history   . S/P cesarean section 08/26/2011    Past Surgical History  Procedure Laterality Date  . Cesarean section      Breech, 2nd FTD  . Cesarean section  08/23/2011    Procedure: CESAREAN SECTION;  Surgeon: Bing Plume, MD;  Location: [facility];  Service: Gynecology;  Laterality: N/A;  Repeat    There were no vitals filed for this visit.  Visit Diagnosis:  Cervical strain, initial encounter  Neck pain  Range of motion deficit  Decreased strength      Subjective Assessment - 06/22/15 1151    Subjective She reports a little better. Less painful but can feel pain constantly.  Wearring wrist brace.    Currently in Pain? Yes   Pain Score 4    Pain Location Neck   Pain Orientation Right   Pain Type Acute pain   Pain Onset More than a month ago   Pain Frequency Constant  meds ease but don't eliminate pain   Pain Relieving Factors Medication   Multiple Pain Sites No                         [facility] Adult PT Treatment/Exercise - 06/22/15 1154    Moist Heat Therapy   Number Minutes Moist Heat 15 Minutes   Moist Heat Location Cervical   Ultrasound   Ultrasound Location RT neck and trap   Ultrasound Parameters 100% 1.6 Wcm2   Ultrasound Goals Pain   Manual Therapy   Soft tissue mobilization RT>LT multiple tight areas light pressure an heavier pressure to levator tender point,  instrument assist intermittantly  .Assisted stretching 2 setx 2 stetches lavator and scalenes  30 sec  and gentle stretch with use of  blade                    PT Short Term Goals - 06/20/15 1422    PT SHORT TERM GOAL #1   Title Patient to demonstrate independence with initial HEP.    Time 2   Period Weeks   Status Achieved   PT SHORT TERM GOAL #2   Title Patient to demonstrate 25 degrees of cervical flexion for reading tasks.    Time 2   Period Weeks   Status Unable to assess   PT SHORT TERM GOAL #3   Title Patient to rate her pain a less than or equal to 3/10 for ADL tolerance   Baseline 4/10   Time 2   Period Weeks   Status On-going           PT Long Term Goals - 06/02/15 1035    PT LONG TERM GOAL #1   Title Patient to  demonstrate independence with advanced HEP for continuation after D/C   Time 6   Period Weeks   Status New   PT LONG TERM GOAL #2   Title Patient to demo 50 degrees of cervical rotation rt for driving tasks.    Time 6   Period Weeks   Status New   PT LONG TERM GOAL #3   Title Patient to demo 45 degrees cervical flexion for reading/writing tasks.   Time 6   Period Weeks   Status New   PT LONG TERM GOAL #4   Title Patient to rate her pain as 0/10 at rest.    Time 6   Period Weeks   Status New   PT LONG TERM GOAL #5   Title Patient to report ability to return to work.    Time 6   Period Weeks               Plan - 06/22/15 1223    Clinical Impression Statement Essentially same as of last visit. She tolerates exercise and manual well .  She appears to be slowly progressing. May benefit from needling   PT Next Visit Plan modalities , manual , , stab exercises and levator stretching   Consulted and Agree with Plan of Care Patient        Problem List Patient Active Problem List   Diagnosis Date  Noted  . S/P cesarean section 08/26/2011    Caprice RedChasse, Haim Hansson M PT 06/22/2015, 12:25 PM  [facility] [address] [place], [place], [address] Phone: [phone]   Fax:  [phone]  Name: Argentina PonderShelley S Skibinski MRN: [id number] Date of Birth: 04-25-1985

## 2015-06-27 ENCOUNTER — Ambulatory Visit: Payer: Medicaid Other | Admitting: Physical Therapy

## 2015-06-29 ENCOUNTER — Ambulatory Visit: Payer: Medicaid Other

## 2015-07-04 ENCOUNTER — Ambulatory Visit: Payer: Medicaid Other | Admitting: Physical Therapy

## 2015-07-04 DIAGNOSIS — R531 Weakness: Secondary | ICD-10-CM

## 2015-07-04 DIAGNOSIS — S161XXA Strain of muscle, fascia and tendon at neck level, initial encounter: Secondary | ICD-10-CM

## 2015-07-04 DIAGNOSIS — M2560 Stiffness of unspecified joint, not elsewhere classified: Secondary | ICD-10-CM

## 2015-07-04 DIAGNOSIS — M542 Cervicalgia: Secondary | ICD-10-CM

## 2015-07-04 NOTE — Therapy (Signed)
[place], [place], [address] Phone: [phone]   Fax:  [phone]  Physical Therapy Treatment  Patient Details  Name: [name] MRN: [id number] Date of Birth: May 16, 1985 Referring Provider: Melrose Nakayama  Encounter Date: 07/04/2015      PT End of Session - 07/04/15 1305    Visit Number 5   Number of Visits 12   Date for PT Re-Evaluation 07/28/15   PT Start Time 9450   PT Stop Time 1250   PT Time Calculation (min) 65 min   Activity Tolerance Patient tolerated treatment well   Behavior During Therapy [facility] for tasks assessed/performed      Past Medical History  Diagnosis Date  . No pertinent past medical history   . S/P cesarean section 08/26/2011    Past Surgical History  Procedure Laterality Date  . Cesarean section      Breech, 2nd FTD  . Cesarean section  08/23/2011    Procedure: CESAREAN SECTION;  Surgeon: Melina Schools, MD;  Location: [facility];  Service: Gynecology;  Laterality: N/A;  Repeat    There were no vitals filed for this visit.  Visit Diagnosis:  Cervical strain, initial encounter  Neck pain  Range of motion deficit  Decreased strength      Subjective Assessment - 07/04/15 1146    Subjective Saw Dr Esperanza Richters last week.  Had an injection RT wrist and wears brace for wrist off and on and through out the night. Able to move head a little more. Not yet sleeping on RT side. Has a slight headach   Currently in Pain? Yes   Pain Score 4    Pain Location Neck   Pain Orientation Right   Pain Descriptors / Indicators Pressure   Pain Radiating Towards Collar bone   Pain Frequency Constant   Aggravating Factors  sleeping rt Side.  Looking down with phone,  Holding phone with neck,  Rt and left.    Pain Relieving Factors change of position   Effect of Pain on Daily Activities Medication, ice,   Pain Score 5   Pain Location Wrist   Pain Orientation Right   Pain Descriptors  / Indicators Aching;Burning;Throbbing   Aggravating Factors  using   Pain Relieving Factors Brace, medication            [facility] PT Assessment - 07/04/15 0001    AROM   Cervical Flexion 22   Cervical Extension 30                     [facility] Adult PT Treatment/Exercise - 07/04/15 0001    Neck Exercises: Machines for Strengthening   UBE (Upper Arm Bike) Nu step Hurts wrist RT so stopped   Neck Exercises: Supine   Cervical Isometrics Limitations Head press from neutral 2 X Stopped due to clavicle burning like  she was popped with hot grease from skillet   Hand Exercises for Cervical Radiculopathy   Other Hand Exercise for Cervical Radiculopathy 4 way isometrics 5 X 5 cues for pain free.  Painful intermittantlyAbduction lateral deltiod,  wrist painful with IR/ER  using berace.  avoided distal wrist.    Moist Heat Therapy   Number Minutes Moist Heat 15 Minutes   Moist Heat Location Cervical   Manual Therapy   Manual therapy comments soft tissue work manual and instrument assist,  light pressure   Neck Exercises: Stretches   Levator Stretch 3 reps;20 seconds  both sides  PT Education - 07/04/15 1300    Education provided Yes   Education Details Dry Needle handout issued from drawer.   Person(s) Educated Patient   Methods Handout   Comprehension Other (comment);Verbalized understanding  info only,  I did not want to push her.          PT Short Term Goals - 07/04/15 1310    PT SHORT TERM GOAL #1   Title Patient to demonstrate independence with initial HEP.    Time 2   Period Weeks   Status Achieved   PT SHORT TERM GOAL #2   Title Patient to demonstrate 25 degrees of cervical flexion for reading tasks.    Baseline 22   Time 2   Period Weeks   Status Partially Met   PT SHORT TERM GOAL #3   Title Patient to rate her pain a less than or equal to 3/10 for ADL tolerance   Baseline varies   Time 2   Period Weeks   Status On-going            PT Long Term Goals - 06/02/15 1035    PT LONG TERM GOAL #1   Title Patient to demonstrate independence with advanced HEP for continuation after D/C   Time 6   Period Weeks   Status New   PT LONG TERM GOAL #2   Title Patient to demo 50 degrees of cervical rotation rt for driving tasks.    Time 6   Period Weeks   Status New   PT LONG TERM GOAL #3   Title Patient to demo 45 degrees cervical flexion for reading/writing tasks.   Time 6   Period Weeks   Status New   PT LONG TERM GOAL #4   Title Patient to rate her pain as 0/10 at rest.    Time 6   Period Weeks   Status New   PT LONG TERM GOAL #5   Title Patient to report ability to return to work.    Time 6   Period Weeks               Plan - 07/04/15 1305    Clinical Impression Statement Progress toward STG#2 with neck flexion 22 degrees.  Light touch needed with soft tissue work,  many areas sensitive and tight.  She could not tolerate supine head press due to burning pain radiating to clavicle RT.   PT Next Visit Plan Try [place] again? try scapular rows etc vs supine stabilization or exercise standing against wall with bands.   PT Home Exercise Plan No new exercises issued.    Consulted and Agree with Plan of Care Patient        Problem List Patient Active Problem List   Diagnosis Date Noted  . S/P cesarean section 08/26/2011    [facility] 07/04/2015, 1:11 PM  [facility] [address] [place], [place], [address] Phone: [phone]   Fax:  [phone]  Name: [name] MRN: [id number] Date of Birth: 1985/04/28    Melvenia Needles, PTA 07/04/2015 1:11 PM Phone: [phone] Fax: [phone]

## 2015-07-06 ENCOUNTER — Ambulatory Visit: Payer: Medicaid Other

## 2015-07-21 ENCOUNTER — Ambulatory Visit: Payer: Medicaid Other | Attending: Orthopaedic Surgery

## 2015-07-21 DIAGNOSIS — R293 Abnormal posture: Secondary | ICD-10-CM

## 2015-07-21 DIAGNOSIS — M542 Cervicalgia: Secondary | ICD-10-CM | POA: Insufficient documentation

## 2015-07-21 NOTE — Therapy (Addendum)
[place], [place], [address] Phone: [phone]   Fax:  [phone]  Physical Therapy Treatment  Patient Details  Name: [name] MRN: [id number] Date of Birth: 1985/07/23 Referring Provider: Melrose Nakayama  Encounter Date: 07/21/2015      PT End of Session - 07/21/15 1010    Visit Number 6   Number of Visits 12   Date for PT Re-Evaluation 07/28/15   PT Start Time 0930   PT Stop Time 1022   PT Time Calculation (min) 52 min   Activity Tolerance Patient tolerated treatment well   Behavior During Therapy [facility] for tasks assessed/performed      Past Medical History  Diagnosis Date  . No pertinent past medical history   . S/P cesarean section 08/26/2011    Past Surgical History  Procedure Laterality Date  . Cesarean section      Breech, 2nd FTD  . Cesarean section  08/23/2011    Procedure: CESAREAN SECTION;  Surgeon: Melina Schools, MD;  Location: [facility];  Service: Gynecology;  Laterality: N/A;  Repeat    There were no vitals filed for this visit.  Visit Diagnosis:  Cervicalgia  Abnormal posture      Subjective Assessment - 07/21/15 0938    Subjective She reports doing better with neck pain 1/10 RT neck. Mostly just a feeling not really painful . Last real pain 5 days ago at end of work schedule. And soen cervico-thoracic pain didn't las t lond   Currently in Pain? Yes   Pain Score 1    Pain Location Neck   Pain Orientation Right  neck and shoulder   Pain Descriptors / Indicators --  just does feel 100%   Pain Type Chronic pain   Pain Frequency Constant   Multiple Pain Sites No            [facility] PT Assessment - 07/21/15 0941    AROM   Cervical Flexion 48   Cervical Extension 35   Cervical - Right Side Bend 35   Cervical - Left Side Bend 30   Cervical - Right Rotation 70   Cervical - Left Rotation 70   Strength   Overall Strength --  5-/5   Cervical Flexion 5/5   Cervical Extension 5/5   Cervical - Right Side Bend 5/5   Cervical - Left Side Bend 5/5                     [facility] Adult PT Treatment/Exercise - 07/21/15 0001    Moist Heat Therapy   Number Minutes Moist Heat 12 Minutes   Moist Heat Location Cervical   Ultrasound   Ultrasound Location RT neck   Ultrasound Parameters 100% 1.5cm2, 1 MHz   Ultrasound Goals Pain   Manual Therapy   Manual therapy comments IASTM soft tissue work manual and instrument assist,  light pressure   Soft tissue mobilization RT  instrument assist intermittantly  .Assisted stretching 2 setx 2 stetches lavator and scalenes  30 sec  and gentle stretch with use of  blade                    PT Short Term Goals - 07/21/15 0940    PT SHORT TERM GOAL #1   Title Patient to demonstrate independence with initial HEP.    Status Achieved   PT SHORT TERM GOAL #2   Title Patient to demonstrate 25 degrees of cervical  flexion for reading tasks.    Status Achieved   PT SHORT TERM GOAL #3   Title Patient to rate her pain a less than or equal to 3/10 for ADL tolerance   Status Achieved           PT Long Term Goals - 07/21/15 0946    PT LONG TERM GOAL #1   Title Patient to demonstrate independence with advanced HEP for continuation after D/C   Status On-going   PT LONG TERM GOAL #2   Title Patient to demo 50 degrees of cervical rotation rt for driving tasks.    Status Achieved   PT LONG TERM GOAL #3   Title Patient to demo 45 degrees cervical flexion for reading/writing tasks.   Status Partially Met   PT LONG TERM GOAL #4   Title Patient to rate her pain as 0/10 at rest.    Status On-going   PT LONG TERM GOAL #5   Title Patient to report ability to return to work.    Status Achieved               Plan - 07/21/15 1011    Clinical Impression Statement Much improved without Pt in 2 weeks. We will s ee her for 3-4 more sessions if attendance is good. May add scapula band exercise. Range  and strength both improved   PT Next Visit Plan Continue modalities and add band stab exercise   Consulted and Agree with Plan of Care Patient        Problem List Patient Active Problem List   Diagnosis Date Noted  . S/P cesarean section 08/26/2011    Chasse, Stephen M PT 07/21/2015, 10:13 AM  [facility] [address] [place], [place], [address] Phone: [phone]   Fax:  [phone]  Name: [name] MRN: [id number] Date of Birth: 08/30/1985    PHYSICAL THERAPY DISCHARGE SUMMARY  Visits from Start of Care: 6  Current functional level related to goals / functional outcomes: See above . Unknown as she did not return    after this last session   Remaining deficits: Unknown   Education / Equipment: HEP Plan:                                                    Patient goals were not met. Patient is being discharged due to not returning since the last visit.  ?????   Stephen M Chasse,  PT    08/22/15      12:52  PM  

## 2015-07-26 ENCOUNTER — Ambulatory Visit: Payer: Medicaid Other

## 2015-07-28 ENCOUNTER — Ambulatory Visit: Payer: Medicaid Other | Admitting: Physical Therapy

## 2015-08-01 ENCOUNTER — Ambulatory Visit: Payer: Medicaid Other

## 2015-08-03 ENCOUNTER — Ambulatory Visit: Payer: Medicaid Other | Admitting: Physical Therapy

## 2018-03-10 ENCOUNTER — Other Ambulatory Visit: Payer: Self-pay

## 2018-03-10 ENCOUNTER — Encounter (HOSPITAL_COMMUNITY): Payer: Self-pay

## 2018-03-10 ENCOUNTER — Ambulatory Visit (HOSPITAL_COMMUNITY)
Admission: EM | Admit: 2018-03-10 | Discharge: 2018-03-10 | Disposition: A | Discharge status: Home / Self Care | Payer: Self-pay | Attending: Family Medicine | Admitting: Family Medicine

## 2018-03-10 DIAGNOSIS — N760 Acute vaginitis: Secondary | ICD-10-CM | POA: Insufficient documentation

## 2018-03-10 LAB — POCT URINALYSIS DIP (DEVICE)
Bilirubin Urine: NEGATIVE
Glucose, UA: NEGATIVE mg/dL
Ketones, ur: NEGATIVE mg/dL
NITRITE: NEGATIVE
Protein, ur: NEGATIVE mg/dL
Specific Gravity, Urine: 1.015 (ref 1.005–1.030)
Urobilinogen, UA: 1 mg/dL (ref 0.0–1.0)
pH: 7.5 (ref 5.0–8.0)

## 2018-03-10 MED ORDER — FLUCONAZOLE 150 MG PO TABS: 150.0000 mg | ORAL_TABLET | Freq: Every day | ORAL | 0 refills | Status: DC

## 2018-03-10 NOTE — ED Triage Notes (Signed)
Pt cc vaginal discharge and some abdominal cramps x 3 days.

## 2018-03-10 NOTE — Discharge Instructions (Addendum)
Your urine was negative for infection and pregnancy We will go ahead and treat you for a vaginal yeast infection today and send your swab for further testing We will call with any positive results

## 2018-03-10 NOTE — ED Provider Notes (Signed)
[facility]    CSN: [id number] Arrival date & time: 03/10/18  1255     History   Chief Complaint Chief Complaint  Patient presents with  . Vaginal Discharge    HPI [name] is a 32 y.o. female.   Pt is a 32 year old female that presents with lower abd cramping, thick white vaginal discharge, itching and mild odor. Symptoms have been constant and remain the same over the last 3 days.  She has not done anything to treat her symptoms.  She is currently sexually active, unprotected.  Her last menstrual cycle was last week.  She denies any abdominal pain, back pain, fever, chills, nausea, vaginal bleeding.  ROS per HPI    Vaginal Discharge    Past Medical History:  Diagnosis Date  . No pertinent past medical history   . S/P cesarean section 08/26/2011    Patient Active Problem List   Diagnosis Date Noted  . S/P cesarean section 08/26/2011    Past Surgical History:  Procedure Laterality Date  . CESAREAN SECTION     Breech, 2nd FTD  . CESAREAN SECTION  08/23/2011   Procedure: CESAREAN SECTION;  Surgeon: Bing Plume, MD;  Location: [facility];  Service: Gynecology;  Laterality: N/A;  Repeat    OB History    Gravida  4   Para  4   Term  4   Preterm      AB      Living  4     SAB      TAB      Ectopic      Multiple      Live Births  1            Home Medications    Prior to Admission medications   Medication Sig Start Date End Date Taking? Authorizing Provider  cyclobenzaprine (FLEXERIL) 10 MG tablet Take 1 tablet (10 mg total) by mouth at bedtime as needed. Muscle spasms. 05/25/15   Dowless, Lelon Mast Tripp, PA-C  fluconazole (DIFLUCAN) 150 MG tablet Take 1 tablet (150 mg total) by mouth daily. 03/10/18   Dahlia Byes A, NP  meloxicam (MOBIC) 7.5 MG tablet Take 7.5 mg by mouth 2 (two) times daily. 05/04/15   [provider]  methocarbamol (ROBAXIN) 500 MG tablet Take 1 tablet (500 mg total) by mouth 2 (two) times  daily. Patient not taking: Reported on 05/25/2015 04/19/15   Antony Madura, PA-C  naproxen (NAPROSYN) 500 MG tablet Take 1 tablet (500 mg total) by mouth 2 (two) times daily. 04/19/15   Antony Madura, PA-C  permethrin (ELIMITE) 5 % cream Use as directed on package, repeat in 1 week. Patient not taking: Reported on 05/25/2015 03/28/15   Linna Hoff, MD  Phenylephrine-Acetaminophen (TYLENOL SINUS CONGESTION/PAIN PO) Take 2 tablets by mouth 2 (two) times daily.    [provider]  traMADol (ULTRAM) 50 MG tablet Take 1 tablet (50 mg total) by mouth 2 (two) times daily as needed. Pain. 05/25/15   Dowless, Lester Kinsman, PA-C    Family History Family History  Problem Relation Age of Onset  . Healthy Mother   . Healthy Father     Social History Social History   Tobacco Use  . Smoking status: Never Smoker  . Smokeless tobacco: Never Used  Substance Use Topics  . Alcohol use: No  . Drug use: No     Allergies   Peanut-containing drug products   Review of Systems Review of  Systems  Genitourinary: Positive for vaginal discharge.     Physical Exam Triage Vital Signs ED Triage Vitals  Enc Vitals Group     BP 03/10/18 1404 136/86     Pulse Rate 03/10/18 1404 71     Resp 03/10/18 1404 20     Temp 03/10/18 1404 98.6 F (37 C)     Temp Source 03/10/18 1404 Oral     SpO2 03/10/18 1404 99 %     Weight 03/10/18 1405 (!) 320 lb (145.2 kg)     Height --      Head Circumference --      Peak Flow --      Pain Score 03/10/18 1405 6     Pain Loc --      Pain Edu? --      Excl. in GC? --    No data found.  Updated Vital Signs BP 136/86 (BP Location: Right Arm)   Pulse 71   Temp 98.6 F (37 C) (Oral)   Resp 20   Wt (!) 320 lb (145.2 kg)   LMP 03/08/2018   SpO2 99%   BMI 47.26 kg/m   Visual Acuity Right Eye Distance:   Left Eye Distance:   Bilateral Distance:    Right Eye Near:   Left Eye Near:    Bilateral Near:     Physical Exam  Constitutional: She appears  well-developed and well-nourished.  HENT:  Head: Normocephalic.  Eyes: Conjunctivae are normal.  Neck: Normal range of motion.  Pulmonary/Chest: Effort normal.  Abdominal: Soft. Bowel sounds are normal. She exhibits no distension and no mass. There is no rebound and no guarding. No hernia.  Mild tenderness to suprapubic area  Musculoskeletal: Normal range of motion.  Neurological: She is alert.  Skin: Skin is warm and dry.  Psychiatric: She has a normal mood and affect.  Nursing note and vitals reviewed.    UC Treatments / Results  Labs (all labs ordered are listed, but only abnormal results are displayed) Labs Reviewed  POCT URINALYSIS DIP (DEVICE) - Abnormal; Notable for the following components:      Result Value   Hgb urine dipstick TRACE (*)    Leukocytes, UA SMALL (*)    All other components within normal limits  URINE CULTURE    EKG None  Radiology No results found.  Procedures Procedures (including critical care time)  Medications Ordered in UC Medications - No data to display  Initial Impression / Assessment and Plan / UC Course  I have reviewed the triage vital signs and the nursing notes.  Pertinent labs & imaging results that were available during my care of the patient were reviewed by me and considered in my medical decision making (see chart for details).  Clinical Course as of Mar 10 1505  Mon Mar 10, 2018  1432 Specific Gravity, Urine: 1.015 [BH]    Clinical Course User Index [BH] Mardella LaymanHagler, Brian, MD    Vaginitis-patient self swab in clinic.  We will send us for testing. We will go ahead and treat with Diflucan on the basis of symptoms Urine negative for pregnancy and infection.  Will send for culture Final Clinical Impressions(s) / UC Diagnoses   Final diagnoses:  Acute vaginitis     Discharge Instructions     Your urine was negative for infection and pregnancy We will go ahead and treat you for a vaginal yeast infection today and send  your swab for further testing We will call with any  positive results    ED Prescriptions    Medication Sig Dispense Auth. Provider   fluconazole (DIFLUCAN) 150 MG tablet Take 1 tablet (150 mg total) by mouth daily. 2 tablet Dahlia Byes A, NP     Controlled Substance Prescriptions [place] Controlled Substance Registry consulted? Not Applicable   Janace Aris, NP 03/10/18 1507

## 2018-03-11 LAB — URINE CULTURE: Culture: NO GROWTH

## 2018-05-22 ENCOUNTER — Inpatient Hospital Stay (HOSPITAL_COMMUNITY)
Admission: AD | Admit: 2018-05-22 | Discharge: 2018-05-23 | Disposition: A | Discharge status: Home / Self Care | Payer: Self-pay | Source: Ambulatory Visit | Attending: Family Medicine | Admitting: Family Medicine

## 2018-05-22 ENCOUNTER — Encounter (HOSPITAL_COMMUNITY): Payer: Self-pay | Admitting: *Deleted

## 2018-05-22 DIAGNOSIS — R1013 Epigastric pain: Secondary | ICD-10-CM | POA: Insufficient documentation

## 2018-05-22 DIAGNOSIS — Z3202 Encounter for pregnancy test, result negative: Secondary | ICD-10-CM | POA: Insufficient documentation

## 2018-05-22 DIAGNOSIS — N939 Abnormal uterine and vaginal bleeding, unspecified: Secondary | ICD-10-CM | POA: Insufficient documentation

## 2018-05-22 DIAGNOSIS — R110 Nausea: Secondary | ICD-10-CM | POA: Insufficient documentation

## 2018-05-22 LAB — CBC
HCT: 37.6 % (ref 36.0–46.0)
HEMOGLOBIN: 12.1 g/dL (ref 12.0–15.0)
MCH: 29.2 pg (ref 26.0–34.0)
MCHC: 32.2 g/dL (ref 30.0–36.0)
MCV: 90.6 fL (ref 80.0–100.0)
NRBC: 0 % (ref 0.0–0.2)
PLATELETS: 186 10*3/uL (ref 150–400)
RBC: 4.15 MIL/uL (ref 3.87–5.11)
RDW: 14.9 % (ref 11.5–15.5)
WBC: 7.7 10*3/uL (ref 4.0–10.5)

## 2018-05-22 LAB — POCT PREGNANCY, URINE: PREG TEST UR: NEGATIVE

## 2018-05-22 NOTE — MAU Note (Signed)
PT SAYS SHE HAS PINK   SPOTTING -  USED 4 PADS  TODAY.  NO CRAMPING  CYCLES USUALLY .  HAD EXPLANON REMOVED IN OCT .    LMP WAS JAN FOR 2 DAYS . LAST SEX- 3 WEEKS AGO.  NO BIRTH CONTROL NOW

## 2018-05-22 NOTE — MAU Note (Signed)
Small amount of vaginal bleeding that started yesterday morning.  No abdominal cramping.  Had a positive HPT last week.  LMP 03/30/2018

## 2018-05-23 DIAGNOSIS — R1013 Epigastric pain: Secondary | ICD-10-CM

## 2018-05-23 DIAGNOSIS — R110 Nausea: Secondary | ICD-10-CM

## 2018-05-23 LAB — URINALYSIS, ROUTINE W REFLEX MICROSCOPIC
Bilirubin Urine: NEGATIVE
GLUCOSE, UA: NEGATIVE mg/dL
KETONES UR: NEGATIVE mg/dL
Nitrite: NEGATIVE
PROTEIN: NEGATIVE mg/dL
Specific Gravity, Urine: 1.01 (ref 1.005–1.030)
pH: 7.5 (ref 5.0–8.0)

## 2018-05-23 LAB — URINALYSIS, MICROSCOPIC (REFLEX)

## 2018-05-23 LAB — HCG, QUANTITATIVE, PREGNANCY: hCG, Beta Chain, Quant, S: <1 m[IU]/mL (ref <5)

## 2018-05-23 LAB — HIV ANTIBODY (ROUTINE TESTING W REFLEX): HIV Screen 4th Generation wRfx: NONREACTIVE

## 2018-05-23 MED ORDER — ONDANSETRON 4 MG PO TBDP
4.0000 mg | ORAL_TABLET | Freq: Once | ORAL | Status: AC
  Administered 2018-05-23: 4 mg via ORAL
  Filled 2018-05-23: qty 1

## 2018-05-23 MED ORDER — ONDANSETRON 4 MG PO TBDP: 4.0000 mg | ORAL_TABLET | Freq: Four times a day (QID) | ORAL | 0 refills | Status: DC | PRN

## 2018-05-23 MED ORDER — SIMETHICONE 80 MG PO CHEW
80.0000 mg | CHEWABLE_TABLET | Freq: Once | ORAL | Status: AC
  Administered 2018-05-23: 80 mg via ORAL
  Filled 2018-05-23: qty 1

## 2018-05-23 NOTE — Discharge Instructions (Signed)
Abdominal Pain, Adult  Abdominal pain can be caused by many things. Often, abdominal pain is not serious and it gets better with no treatment or by being treated at home. However, sometimes abdominal pain is serious. Your health care provider will do a medical history and a physical exam to try to determine the cause of your abdominal pain.  Follow these instructions at home:   Take over-the-counter and prescription medicines only as told by your health care provider. Do not take a laxative unless told by your health care provider.   Drink enough fluid to keep your urine clear or pale yellow.   Watch your condition for any changes.   Keep all follow-up visits as told by your health care provider. This is important.  Contact a health care provider if:   Your abdominal pain changes or gets worse.   You are not hungry or you lose weight without trying.   You are constipated or have diarrhea for more than 2-3 days.   You have pain when you urinate or have a bowel movement.   Your abdominal pain wakes you up at night.   Your pain gets worse with meals, after eating, or with certain foods.   You are throwing up and cannot keep anything down.   You have a fever.  Get help right away if:   Your pain does not go away as soon as your health care provider told you to expect.   You cannot stop throwing up.   Your pain is only in areas of the abdomen, such as the right side or the left lower portion of the abdomen.   You have bloody or black stools, or stools that look like tar.   You have severe pain, cramping, or bloating in your abdomen.   You have signs of dehydration, such as:  ? Dark urine, very little urine, or no urine.  ? Cracked lips.  ? Dry mouth.  ? Sunken eyes.  ? Sleepiness.  ? Weakness.  This information is not intended to replace advice given to you by your health care provider. Make sure you discuss any questions you have with your health care provider.  Document Released: 01/10/2005 Document  Revised: 10/21/2015 Document Reviewed: 09/14/2015  Elsevier Interactive Patient Education  2019 [employer]  Nausea, Adult  Nausea is the feeling that you have an upset stomach or that you are about to vomit. Nausea on its own is not usually a serious concern, but it may be an early sign of a more serious medical problem. As nausea gets worse, it can lead to vomiting. If vomiting develops, or if you are not able to drink enough fluids, you are at risk of becoming dehydrated. Dehydration can make you tired and thirsty, cause you to have a dry mouth, and decrease how often you urinate. Older adults and people with other diseases or a weak disease-fighting system (immune system) are at higher risk for dehydration. The main goals of treating your nausea are:   To relieve your nausea.   To limit repeated nausea episodes.   To prevent vomiting and dehydration.  Follow these instructions at home:  Watch your symptoms for any changes. Tell your health care provider about them. Follow these instructions as told by your health care provider.  Eating and drinking          Take an oral rehydration solution (ORS). This is a drink that is sold at pharmacies and retail stores.   Drink clear   include bananas, applesauce, rice, lean meats, toast, and crackers.  Avoid drinking fluids that contain a lot of sugar or caffeine, such as energy drinks, sports drinks, and soda.  Avoid alcohol.  Avoid spicy or fatty foods. General instructions  Take over-the-counter and prescription medicines only as told by your health care provider.  Rest at home while you recover.  Drink enough fluid to keep your urine pale yellow.  Breathe slowly and deeply when you  feel nauseous.  Avoid smelling things that have strong odors.  Wash your hands often using soap and water. If soap and water are not available, use hand sanitizer.  Make sure that all people in your household wash their hands well and often.  Keep all follow-up visits as told by your health care provider. This is important. Contact a health care provider if:  Your nausea gets worse.  Your nausea does not go away after two days.  You vomit.  You cannot drink fluids without vomiting.  You have any of the following: ? New symptoms. ? A fever. ? A headache. ? Muscle cramps. ? A rash. ? Pain while urinating.  You feel light-headed or dizzy. Get help right away if:  You have pain in your chest, neck, arm, or jaw.  You feel extremely weak or you faint.  You have vomit that is bright red or looks like coffee grounds.  You have bloody or black stools or stools that look like tar.  You have a severe headache, a stiff neck, or both.  You have severe pain, cramping, or bloating in your abdomen.  You have difficulty breathing or are breathing very quickly.  Your heart is beating very quickly.  Your skin feels cold and clammy.  You feel confused.  You have signs of dehydration, such as: ? Dark urine, very little urine, or no urine. ? Cracked lips. ? Dry mouth. ? Sunken eyes. ? Sleepiness. ? Weakness. These symptoms may represent a serious problem that is an emergency. Do not wait to see if the symptoms will go away. Get medical help right away. Call your local emergency services (911 in the [place] Do not drive yourself to the hospital. Summary  Nausea is the feeling that you have an upset stomach or that you are about to vomit. Nausea on its own is not usually a serious concern, but it may be an early sign of a more serious medical problem.  If vomiting develops, or if you are not able to drink enough fluids, you are at risk of becoming dehydrated.  Follow  recommendations for eating and drinking and take over-the-counter and prescription medicines only as told by your health care provider.  Contact a health care provider right away if your symptoms worsen or you have new symptoms.  Keep all follow-up visits as told by your health care provider. This is important. This information is not intended to replace advice given to you by your health care provider. Make sure you discuss any questions you have with your health care provider. Document Released: 05/10/2004 Document Revised: 09/10/2017 Document Reviewed: 09/10/2017 Elsevier Interactive Patient Education  2019 [employer].

## 2018-05-23 NOTE — MAU Provider Note (Signed)
Chief Complaint:  Vaginal Bleeding and Possible Pregnancy   First Provider Initiated Contact with Patient 05/23/18 0026       HPI: [name] is a 33 y.o. [id number] who presents to maternity admissions reporting positive pregnancy test at home and vaginal bleeding (light) that started yesterday.  No pain.  Marland Kitchen.Also c/o epigastric "pressure" and has had nausea for 3 weeks She reports vaginal bleeding, but no  vaginal itching/burning, urinary symptoms, h/a, dizziness, n/v, or fever/chills.    Vaginal Bleeding  The patient's primary symptoms include vaginal bleeding. The patient's pertinent negatives include no genital itching, genital lesions, genital odor or pelvic pain. This is a new problem. The current episode started yesterday. The problem occurs intermittently. The problem has been unchanged. The patient is experiencing no pain. Associated symptoms include nausea. Pertinent negatives include no abdominal pain, back pain, chills, constipation, diarrhea or fever. The vaginal discharge was bloody. The vaginal bleeding is spotting. She has not been passing clots. She has not been passing tissue. Nothing aggravates the symptoms. She has tried nothing for the symptoms.  Possible Pregnancy  This is a new problem. Associated symptoms include nausea. Pertinent negatives include no abdominal pain, chills or fever. Nothing aggravates the symptoms. She has tried nothing for the symptoms.   RN Note: Small amount of vaginal bleeding that started yesterday morning.  No abdominal cramping.  Had a positive HPT last week.  LMP 03/30/2018  Past Medical History: Past Medical History:  Diagnosis Date  . No pertinent past medical history   . S/P cesarean section 08/26/2011    Past obstetric history: OB History  Gravida Para Term Preterm AB Living  4 4 4     4   SAB TAB Ectopic Multiple Live Births          4    # Outcome Date GA Lbr Len/2nd Weight Sex Delivery Anes PTL Lv  4 Term 08/23/11 [id number]  3860 g F  CS-LTranv Spinal  LIV  3 Term      CS-LTranv     2 Term      CS-LTranv     1 Term      Vag-Spont       Past Surgical History: Past Surgical History:  Procedure Laterality Date  . CESAREAN SECTION     Breech, 2nd FTD  . CESAREAN SECTION  08/23/2011   Procedure: CESAREAN SECTION;  Surgeon: Bing Plumehomas F Henley, MD;  Location: [facility];  Service: Gynecology;  Laterality: N/A;  Repeat    Family History: Family History  Problem Relation Age of Onset  . Healthy Mother   . Healthy Father     Social History: Social History   Tobacco Use  . Smoking status: Never Smoker  . Smokeless tobacco: Never Used  Substance Use Topics  . Alcohol use: No  . Drug use: Yes    Types: Marijuana    Comment: last time smoked  4 weeks ago     Allergies:  Allergies  Allergen Reactions  . Peanut-Containing Drug Products Itching and Rash    All nuts    Meds:  Medications Prior to Admission  Medication Sig Dispense Refill Last Dose  . cyclobenzaprine (FLEXERIL) 10 MG tablet Take 1 tablet (10 mg total) by mouth at bedtime as needed. Muscle spasms. 15 tablet 0 More than a month at Unknown time  . fluconazole (DIFLUCAN) 150 MG tablet Take 1 tablet (150 mg total) by mouth daily. 2 tablet 0 More than a month at Unknown time  .  meloxicam (MOBIC) 7.5 MG tablet Take 7.5 mg by mouth 2 (two) times daily.  0 a week  . methocarbamol (ROBAXIN) 500 MG tablet Take 1 tablet (500 mg total) by mouth 2 (two) times daily. (Patient not taking: Reported on 05/25/2015) 20 tablet 0   . naproxen (NAPROSYN) 500 MG tablet Take 1 tablet (500 mg total) by mouth 2 (two) times daily. 30 tablet 0 More than a month at Unknown time  . permethrin (ELIMITE) 5 % cream Use as directed on package, repeat in 1 week. (Patient not taking: Reported on 05/25/2015) 60 g 1   . Phenylephrine-Acetaminophen (TYLENOL SINUS CONGESTION/PAIN PO) Take 2 tablets by mouth 2 (two) times daily.   unknown  . traMADol (ULTRAM) 50 MG tablet Take 1 tablet (50 mg total)  by mouth 2 (two) times daily as needed. Pain. 14 tablet 0 More than a month at Unknown time    I have reviewed patient's Past Medical Hx, Surgical Hx, Family Hx, Social Hx, medications and allergies.  ROS:  Review of Systems  Constitutional: Negative for chills and fever.  Gastrointestinal: Positive for nausea. Negative for abdominal pain, constipation and diarrhea.  Genitourinary: Positive for vaginal bleeding. Negative for pelvic pain.  Musculoskeletal: Negative for back pain.   Other systems negative     Physical Exam   Patient Vitals for the past 24 hrs:  BP Temp Pulse Resp SpO2 Height Weight  05/22/18 2313 118/75 97.8 F (36.6 C) 79 17 100 % 5\' 9"  (1.753 m) (!) 152.3 kg   Constitutional: Well-developed, well-nourished female in no acute distress.  Cardiovascular: normal rate and rhythm, no ectopy audible, S1 & S2 heard, no murmur Respiratory: normal effort, no distress. Lungs CTAB with no wheezes or crackles GI: Abd soft, non-tender.  Nondistended.  No rebound, No guarding.  Bowel Sounds audible  MS: Extremities nontender, no edema, normal ROM Neurologic: Alert and oriented x 4.   Grossly nonfocal. GU: Neg CVAT. Skin:  Warm and Dry Psych:  Affect appropriate.  PELVIC EXAM: Cervix pink, visually closed, without lesion, scant white creamy discharge, vaginal walls and external genitalia normal Bimanual exam: Cervix firm, anterior, neg CMT, uterus nontender, nonenlarged, adnexa without tenderness, enlargement, or mass  Exam is limited by body habitus.    Labs: Results for orders placed or performed during the hospital encounter of 05/22/18 (from the past 24 hour(s))  Urinalysis, Routine w reflex microscopic     Status: Abnormal   Collection Time: 05/22/18 11:31 PM  Result Value Ref Range   Color, Urine YELLOW YELLOW   APPearance CLEAR CLEAR   Specific Gravity, Urine 1.010 1.005 - 1.030   pH 7.5 5.0 - 8.0   Glucose, UA NEGATIVE NEGATIVE mg/dL   Hgb urine dipstick LARGE  (A) NEGATIVE   Bilirubin Urine NEGATIVE NEGATIVE   Ketones, ur NEGATIVE NEGATIVE mg/dL   Protein, ur NEGATIVE NEGATIVE mg/dL   Nitrite NEGATIVE NEGATIVE   Leukocytes, UA TRACE (A) NEGATIVE  Urinalysis, Microscopic (reflex)     Status: Abnormal   Collection Time: 05/22/18 11:31 PM  Result Value Ref Range   RBC / HPF 6-10 0 - 5 RBC/hpf   WBC, UA 0-5 0 - 5 WBC/hpf   Bacteria, UA FEW (A) NONE SEEN   Squamous Epithelial / LPF 0-5 0 - 5  CBC     Status: None   Collection Time: 05/22/18 11:32 PM  Result Value Ref Range   WBC 7.7 4.0 - 10.5 K/uL   RBC 4.15 3.87 - 5.11 MIL/uL  Hemoglobin 12.1 12.0 - 15.0 g/dL   HCT 73.7 10.6 - 26.9 %   MCV 90.6 80.0 - 100.0 fL   MCH 29.2 26.0 - 34.0 pg   MCHC 32.2 30.0 - 36.0 g/dL   RDW 48.5 46.2 - 70.3 %   Platelets 186 150 - 400 K/uL   nRBC 0.0 0.0 - 0.2 %  Pregnancy, urine POC     Status: None   Collection Time: 05/22/18 11:36 PM  Result Value Ref Range   Preg Test, Ur NEGATIVE NEGATIVE      Imaging:  No results found.  MAU Course/MDM: I have ordered labs as follows:  See above  Declined vaginal culture, recently done Imaging ordered: none Results reviewed. Discussed negative HCG test. Last week's test could have been false positive or possibly she miscarried very early    Given Zofran for nausea which did help.  Gave her Simethicone for pressure in epigastric area with no relief Advised she find a primary doctor.  May need GI consult    Pt stable at time of discharge.  Assessment: Positive pregnancy test at home with negative blood test here Epigastric discomfort Nausea  Plan: Discharge home Recommend find primary doctor to establish care with  Rx sent for Zofran  for nausea Numbers for doctors' offices provided  Encouraged to return here or to other Urgent Care/ED if she develops worsening of symptoms, increase in pain, fever, or other concerning symptoms.   Wynelle Bourgeois CNM, MSN Certified Nurse-Midwife 05/23/2018 12:26  AM

## 2018-05-29 ENCOUNTER — Ambulatory Visit
Admission: EM | Admit: 2018-05-29 | Discharge: 2018-05-29 | Disposition: A | Discharge status: Home / Self Care | Payer: Medicaid Other | Attending: Family Medicine | Admitting: Family Medicine

## 2018-05-29 ENCOUNTER — Encounter: Payer: Self-pay | Admitting: Emergency Medicine

## 2018-05-29 DIAGNOSIS — N898 Other specified noninflammatory disorders of vagina: Secondary | ICD-10-CM | POA: Insufficient documentation

## 2018-05-29 LAB — POCT URINALYSIS DIP (MANUAL ENTRY)
Bilirubin, UA: NEGATIVE
Glucose, UA: NEGATIVE mg/dL
Ketones, POC UA: NEGATIVE mg/dL
Nitrite, UA: NEGATIVE
PH UA: 6 (ref 5.0–8.0)
PROTEIN UA: NEGATIVE mg/dL
SPEC GRAV UA: 1.02 (ref 1.010–1.025)
Urobilinogen, UA: 0.2 E.U./dL

## 2018-05-29 LAB — POCT URINE PREGNANCY: PREG TEST UR: NEGATIVE

## 2018-05-29 MED ORDER — METRONIDAZOLE 500 MG PO TABS: 500.0000 mg | ORAL_TABLET | Freq: Two times a day (BID) | ORAL | 0 refills | Status: AC

## 2018-05-29 NOTE — ED Triage Notes (Signed)
Pt presents to [facility] for assessment of 3 days of vaginal discharge after using a soap which caused BV in the past.  Denies pain.

## 2018-05-29 NOTE — Discharge Instructions (Signed)
Please begin metronidazole twice daily for the next week. No alcohol until 24 hours after last tablet.   We are testing you for Gonorrhea, Chlamydia, Trichomonas, Yeast and Bacterial Vaginosis. We will call you if anything is positive and let you know if you require any further treatment. Please inform partners of any positive results.   Please return if symptoms not improving with treatment, development of fever, nausea, vomiting, abdominal pain.

## 2018-05-29 NOTE — ED Provider Notes (Signed)
[facility]    CSN: [id number] Arrival date & time: 05/29/18  1910     History   Chief Complaint No chief complaint on file.   HPI [name] is a 33 y.o. female no contributing past medical history presenting today for evaluation of vaginal discharge.  Patient states that over the past 3 days she has noticed a thin white discharge.  It has been associated with some mild itching and irritation.  Believes this is related to BV as she has had this in the past with similar symptoms.  She notes that she recently changed soaps and symptoms started afterwards.  She denies concerns for STDs.  She notes that she had a miscarriage approximately 1 week ago.  She has had some mild abdominal cramping since then.  She denies any fevers, nausea or vomiting.  Denies any rashes or lesions.  Denies any dysuria or increased frequency.  HPI  Past Medical History:  Diagnosis Date  . No pertinent past medical history   . S/P cesarean section 08/26/2011    Patient Active Problem List   Diagnosis Date Noted  . S/P cesarean section 08/26/2011    Past Surgical History:  Procedure Laterality Date  . CESAREAN SECTION     Breech, 2nd FTD  . CESAREAN SECTION  08/23/2011   Procedure: CESAREAN SECTION;  Surgeon: Bing Plume, MD;  Location: [facility];  Service: Gynecology;  Laterality: N/A;  Repeat    OB History    Gravida  4   Para  4   Term  4   Preterm      AB      Living  4     SAB      TAB      Ectopic      Multiple      Live Births  4            Home Medications    Prior to Admission medications   Medication Sig Start Date End Date Taking? Authorizing Provider  metroNIDAZOLE (FLAGYL) 500 MG tablet Take 1 tablet (500 mg total) by mouth 2 (two) times daily for 7 days. 05/29/18 06/05/18  Maragret Vanacker, Junius Creamer, PA-C    Family History Family History  Problem Relation Age of Onset  . Healthy Mother   . Healthy Father     Social History Social History    Tobacco Use  . Smoking status: Never Smoker  . Smokeless tobacco: Never Used  Substance Use Topics  . Alcohol use: No  . Drug use: Yes    Types: Marijuana    Comment: last time smoked  4 weeks ago      Allergies   Peanut-containing drug products   Review of Systems Review of Systems  Constitutional: Negative for fever.  Respiratory: Negative for shortness of breath.   Cardiovascular: Negative for chest pain.  Gastrointestinal: Negative for abdominal pain, diarrhea, nausea and vomiting.  Genitourinary: Positive for vaginal discharge. Negative for dysuria, flank pain, genital sores, hematuria, menstrual problem, vaginal bleeding and vaginal pain.  Musculoskeletal: Negative for back pain.  Skin: Negative for rash.  Neurological: Negative for dizziness, light-headedness and headaches.     Physical Exam Triage Vital Signs ED Triage Vitals  Enc Vitals Group     BP 05/29/18 1920 114/75     Pulse Rate 05/29/18 1920 78     Resp 05/29/18 1920 18     Temp 05/29/18 1920 98 F (36.7 C)     Temp  Source 05/29/18 1920 Oral     SpO2 05/29/18 1920 98 %     Weight --      Height --      Head Circumference --      Peak Flow --      Pain Score 05/29/18 1921 0     Pain Loc --      Pain Edu? --      Excl. in GC? --    No data found.  Updated Vital Signs BP 114/75 (BP Location: Left Arm)   Pulse 78   Temp 98 F (36.7 C) (Oral)   Resp 18   LMP 05/22/2018   SpO2 98%   Visual Acuity Right Eye Distance:   Left Eye Distance:   Bilateral Distance:    Right Eye Near:   Left Eye Near:    Bilateral Near:     Physical Exam Vitals signs and nursing note reviewed.  Constitutional:      General: She is not in acute distress.    Appearance: She is well-developed.  HENT:     Head: Normocephalic and atraumatic.  Eyes:     Conjunctiva/sclera: Conjunctivae normal.  Neck:     Musculoskeletal: Neck supple.  Cardiovascular:     Rate and Rhythm: Normal rate and regular rhythm.      Heart sounds: No murmur.  Pulmonary:     Effort: Pulmonary effort is normal. No respiratory distress.     Breath sounds: Normal breath sounds.  Abdominal:     Palpations: Abdomen is soft.     Tenderness: There is no abdominal tenderness.     Comments: Nontender light deep palpation throughout abdomen  Genitourinary:    Comments: Deferred Skin:    General: Skin is warm and dry.  Neurological:     Mental Status: She is alert.      UC Treatments / Results  Labs (all labs ordered are listed, but only abnormal results are displayed) Labs Reviewed  POCT URINALYSIS DIP (MANUAL ENTRY) - Abnormal; Notable for the following components:      Result Value   Blood, UA trace-lysed (*)    Leukocytes, UA Large (3+) (*)    All other components within normal limits  POCT URINE PREGNANCY - Normal  URINE CULTURE  CERVICOVAGINAL ANCILLARY ONLY    EKG None  Radiology No results found.  Procedures Procedures (including critical care time)  Medications Ordered in UC Medications - No data to display  Initial Impression / Assessment and Plan / UC Course  I have reviewed the triage vital signs and the nursing notes.  Pertinent labs & imaging results that were available during my care of the patient were reviewed by me and considered in my medical decision making (see chart for details).     Large leuks on UA, will send for culture.  Swab obtained.  Will empirically treat for BV.  Metronidazole twice daily x7 days.Discussed strict return precautions. Patient verbalized understanding and is agreeable with plan.  Final Clinical Impressions(s) / UC Diagnoses   Final diagnoses:  Vaginal discharge     Discharge Instructions     Please begin metronidazole twice daily for the next week. No alcohol until 24 hours after last tablet.   We are testing you for Gonorrhea, Chlamydia, Trichomonas, Yeast and Bacterial Vaginosis. We will call you if anything is positive and let you know if  you require any further treatment. Please inform partners of any positive results.   Please return if symptoms not  improving with treatment, development of fever, nausea, vomiting, abdominal pain.     ED Prescriptions    Medication Sig Dispense Auth. Provider   metroNIDAZOLE (FLAGYL) 500 MG tablet Take 1 tablet (500 mg total) by mouth 2 (two) times daily for 7 days. 14 tablet Tyion Boylen, [place]Hallie C, PA-C     Controlled Substance Prescriptions [place] Controlled Substance Registry consulted? Not Applicable   Lew DawesWieters, [place] Segler C, [place] JerseyPA-C 05/29/18 1940

## 2018-05-29 NOTE — ED Notes (Signed)
Patient able to ambulate independently  

## 2018-05-31 LAB — URINE CULTURE

## 2018-06-02 LAB — CERVICOVAGINAL ANCILLARY ONLY
Bacterial vaginitis: NEGATIVE
CANDIDA VAGINITIS: POSITIVE — AB
Chlamydia: NEGATIVE
Neisseria Gonorrhea: NEGATIVE
Trichomonas: NEGATIVE

## 2018-06-03 ENCOUNTER — Telehealth (HOSPITAL_COMMUNITY): Payer: Self-pay | Admitting: Emergency Medicine

## 2018-06-03 MED ORDER — FLUCONAZOLE 150 MG PO TABS: 150.0000 mg | ORAL_TABLET | Freq: Once | ORAL | 0 refills | Status: AC

## 2018-06-03 NOTE — Telephone Encounter (Signed)
Test for candida (yeast) was positive.  Prescription for fluconazole 150mg po now, repeat dose in 3d if needed, #2 no refills, sent to the pharmacy of record.  Recheck or followup with PCP for further evaluation if symptoms are not improving.    Patient contacted and made aware of all results, all questions answered.   

## 2018-08-20 ENCOUNTER — Ambulatory Visit (HOSPITAL_COMMUNITY)
Admission: EM | Admit: 2018-08-20 | Discharge: 2018-08-20 | Disposition: A | Discharge status: Home / Self Care | Payer: Medicaid Other | Attending: Family Medicine | Admitting: Family Medicine

## 2018-08-20 ENCOUNTER — Other Ambulatory Visit: Payer: Self-pay

## 2018-08-20 ENCOUNTER — Encounter (HOSPITAL_COMMUNITY): Payer: Self-pay | Admitting: Family Medicine

## 2018-08-20 DIAGNOSIS — N898 Other specified noninflammatory disorders of vagina: Secondary | ICD-10-CM

## 2018-08-20 DIAGNOSIS — R1030 Lower abdominal pain, unspecified: Secondary | ICD-10-CM

## 2018-08-20 DIAGNOSIS — K5900 Constipation, unspecified: Secondary | ICD-10-CM

## 2018-08-20 LAB — POCT URINALYSIS DIP (DEVICE)
Bilirubin Urine: NEGATIVE
Glucose, UA: NEGATIVE mg/dL
Ketones, ur: NEGATIVE mg/dL
Leukocytes,Ua: NEGATIVE
Nitrite: NEGATIVE
Protein, ur: NEGATIVE mg/dL
Specific Gravity, Urine: 1.025 (ref 1.005–1.030)
Urobilinogen, UA: 1 mg/dL (ref 0.0–1.0)
pH: 7 (ref 5.0–8.0)

## 2018-08-20 LAB — POCT PREGNANCY, URINE: Preg Test, Ur: NEGATIVE

## 2018-08-20 MED ORDER — METRONIDAZOLE 500 MG PO TABS: 500.0000 mg | ORAL_TABLET | Freq: Two times a day (BID) | ORAL | 0 refills | Status: DC

## 2018-08-20 NOTE — ED Provider Notes (Signed)
[facility]    CSN: [id number] Arrival date & time: 08/20/18  1750     History   Chief Complaint Chief Complaint  Patient presents with  . Abdominal Pain  . Exposure to STD    HPI [name] is a 33 y.o. female.   33 yo woman, [facility], who presents with "stomach pain."  This lower abdominal discomfort is suprapubic and comes and goes.  It began three days ago.  Patient has had some mild constipation but no nausea, vomiting, or diarrhea.  She's had no fever.  Patient has a h/o BV.  She wonders if this is the problem.  Her LMP was several weeks ago and was lighter than normal.  She is not contracepting and is sexually active.  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     Past Medical History:  Diagnosis Date  . No pertinent past medical history   . S/P cesarean section 08/26/2011    Patient Active Problem List   Diagnosis Date Noted  . S/P cesarean section 08/26/2011    Past Surgical History:  Procedure Laterality Date  . CESAREAN SECTION     Breech, 2nd FTD  . CESAREAN SECTION  08/23/2011   Procedure: CESAREAN SECTION;  Surgeon: Bing Plumehomas F Henley, MD;  Location: [facility];  Service: Gynecology;  Laterality: N/A;  Repeat    OB History    Gravida  4   Para  4   Term  4   Preterm      AB      Living  4     SAB      TAB      Ectopic      Multiple      Live Births  4            Home Medications    Prior to Admission medications   Medication Sig Start Date End Date Taking? Authorizing Provider  metroNIDAZOLE (FLAGYL) 500 MG tablet Take 1 tablet (500 mg total) by mouth 2 (two) times daily. 08/20/18   Elvina SidleLauenstein, Gerell Fortson, MD    Family History Family History  Problem Relation Age of Onset  . Healthy Mother   . Healthy Father     Social History Social History   Tobacco Use  . Smoking status: Never Smoker  . Smokeless tobacco: Never Used  Substance Use Topics  . Alcohol use: No  . Drug use: Yes   Types: Marijuana    Comment: last time smoked  4 weeks ago      Allergies   Peanut-containing drug products   Review of Systems Review of Systems  Constitutional: Negative.   Respiratory: Negative.   Gastrointestinal: Positive for abdominal pain and constipation.     Physical Exam Triage Vital Signs ED Triage Vitals  Enc Vitals Group     BP      Pulse      Resp      Temp      Temp src      SpO2      Weight      Height      Head Circumference      Peak Flow      Pain Score      Pain Loc      Pain Edu?      Excl. in GC?    No data found.  Updated Vital Signs BP 139/71   Pulse 85   Temp 98 F (36.7  C)   LMP 08/09/2018 (Approximate)   SpO2 99%    Physical Exam Vitals signs and nursing note reviewed.  Constitutional:      Appearance: She is well-developed. She is obese.  HENT:     Head: Normocephalic.     Mouth/Throat:     Mouth: Mucous membranes are moist.  Cardiovascular:     Rate and Rhythm: Normal rate and regular rhythm.  Pulmonary:     Effort: Pulmonary effort is normal.  Abdominal:     General: Bowel sounds are normal.     Palpations: Abdomen is soft. There is no mass.     Tenderness: There is no abdominal tenderness.  Skin:    General: Skin is warm and dry.  Neurological:     General: No focal deficit present.     Mental Status: She is alert.  Psychiatric:        Mood and Affect: Mood normal.        Behavior: Behavior normal.      UC Treatments / Results  Labs (all labs ordered are listed, but only abnormal results are displayed) Labs Reviewed  POCT URINALYSIS DIP (DEVICE) - Abnormal; Notable for the following components:      Result Value   Hgb urine dipstick TRACE (*)    All other components within normal limits  POC URINE PREG, ED  CERVICOVAGINAL ANCILLARY ONLY    EKG None  Radiology No results found.  Procedures Procedures (including critical care time)  Medications Ordered in UC Medications - No data to display   Initial Impression / Assessment and Plan / UC Course  I have reviewed the triage vital signs and the nursing notes.  Pertinent labs & imaging results that were available during my care of the patient were reviewed by me and considered in my medical decision making (see chart for details).    Final Clinical Impressions(s) / UC Diagnoses   Final diagnoses:  Lower abdominal pain  Constipation, unspecified constipation type  Vaginal discharge     Discharge Instructions     Try to increase fruits and vegetables.  If this does not help with constipation, then use Miralax powder twice daily in water or juice as directed on label.   We will notify you of any positive tests that are pending.    ED Prescriptions    Medication Sig Dispense Auth. Provider   metroNIDAZOLE (FLAGYL) 500 MG tablet Take 1 tablet (500 mg total) by mouth 2 (two) times daily. 14 tablet Elvina Sidle, MD     Controlled Substance Prescriptions [place] Controlled Substance Registry consulted? Not Applicable   Elvina Sidle, MD 08/20/18 [phone]

## 2018-08-20 NOTE — ED Triage Notes (Signed)
Pt has c/o lower abdominal pain and vaginal discharge

## 2018-08-20 NOTE — Discharge Instructions (Addendum)
Try to increase fruits and vegetables.  If this does not help with constipation, then use Miralax powder twice daily in water or juice as directed on label.   We will notify you of any positive tests that are pending.

## 2018-08-21 LAB — CERVICOVAGINAL ANCILLARY ONLY
Bacterial vaginitis: NEGATIVE
Chlamydia: NEGATIVE
Neisseria Gonorrhea: NEGATIVE
Trichomonas: NEGATIVE

## 2018-10-14 ENCOUNTER — Encounter (HOSPITAL_COMMUNITY): Payer: Self-pay

## 2018-10-14 ENCOUNTER — Other Ambulatory Visit: Payer: Self-pay

## 2018-10-14 ENCOUNTER — Ambulatory Visit (HOSPITAL_COMMUNITY)
Admission: EM | Admit: 2018-10-14 | Discharge: 2018-10-14 | Disposition: A | Discharge status: Home / Self Care | Payer: Medicaid Other | Attending: Family Medicine | Admitting: Family Medicine

## 2018-10-14 DIAGNOSIS — Z3202 Encounter for pregnancy test, result negative: Secondary | ICD-10-CM

## 2018-10-14 DIAGNOSIS — N912 Amenorrhea, unspecified: Secondary | ICD-10-CM

## 2018-10-14 DIAGNOSIS — R300 Dysuria: Secondary | ICD-10-CM | POA: Insufficient documentation

## 2018-10-14 DIAGNOSIS — R110 Nausea: Secondary | ICD-10-CM | POA: Insufficient documentation

## 2018-10-14 DIAGNOSIS — N898 Other specified noninflammatory disorders of vagina: Secondary | ICD-10-CM

## 2018-10-14 LAB — POCT URINALYSIS DIP (DEVICE)
Bilirubin Urine: NEGATIVE
Glucose, UA: NEGATIVE mg/dL
Ketones, ur: NEGATIVE mg/dL
Leukocytes,Ua: NEGATIVE
Nitrite: NEGATIVE
Protein, ur: NEGATIVE mg/dL
Specific Gravity, Urine: >=1.03 (ref 1.005–1.030)
Urobilinogen, UA: 0.2 mg/dL (ref 0.0–1.0)
pH: 5.5 (ref 5.0–8.0)

## 2018-10-14 LAB — HCG, QUANTITATIVE, PREGNANCY: hCG, Beta Chain, Quant, S: <1 m[IU]/mL (ref <5)

## 2018-10-14 LAB — POCT PREGNANCY, URINE: Preg Test, Ur: NEGATIVE

## 2018-10-14 NOTE — ED Triage Notes (Signed)
Patient presents to Urgent Care with complaints of slight burning and discomfort since 2 days ago. Patient reports she thinks she might also be pregnant, had spotting 2 days ago but the last normal period was April 2020. Pt endorses nausea and vomiting in the mornings.

## 2018-10-14 NOTE — ED Provider Notes (Signed)
[facility]   [id number] 10/14/18 Arrival Time: 1756  ASSESSMENT & PLAN:  1. Vaginal irritation   2. Dysuria   3. Nausea    UPT negative. Serum HCG pending. U/A without signs of infection. Declines baseline labs at this time. Vaginal cytology pending.   Discharge Instructions     We will call you tomorrow with the results of your blood pregnancy test.  We have sent testing for sexually transmitted infections. We will notify you of any positive results once they are received. If required, we will prescribe any medications you might need.  Please refrain from all sexual activity for at least the next seven days.    Pending: Labs Reviewed  POCT URINALYSIS DIP (DEVICE) - Abnormal; Notable for the following components:      Result Value   Hgb urine dipstick TRACE (*)    All other components within normal limits  HCG, QUANTITATIVE, PREGNANCY  POC URINE PREG, ED  POCT PREGNANCY, URINE  CERVICOVAGINAL ANCILLARY ONLY   Will notify of any positive results. Instructed to refrain from sexual activity for at least seven days.  Follow-up Information    Schedule an appointment as soon as possible for a visit  [facility]   Specialty: Family Medicine Contact information: [address], [address] [place] [phone]         Reviewed expectations re: course of current medical issues. Questions answered. Outlined signs and symptoms indicating need for more acute intervention. Patient verbalized understanding. After Visit Summary given.   SUBJECTIVE:  [name] is a 33 y.o. female who presents with complaint of mild dysuria first noted approx 2 days ago. No worsening. Reports slight vaginal irritation at end of urination. No vaginal discharge. No gross hematuria. Occasional mild nausea with only sporadic emesis over the past couple of months. Normal PO intake. Weight stable. Ambulatory without difficulty. No  specific abdominal, pelvic, or back pain. No genital rashes/lesions. Sexually active with single female partner. No GERD symptoms. OTC treatment: none reported.  Patient's last menstrual period was 07/22/2018 (approximate). "Not really sure. Spotting occasionally."  ROS: As per HPI. All other systems negative.   OBJECTIVE:  Vitals:   10/14/18 1834  BP: (!) 156/99  Pulse: 98  Resp: 16  Temp: 98.6 F (37 C)  TempSrc: Oral  SpO2: 100%    General appearance: alert, cooperative, appears stated age and no distress Throat: lips, mucosa, and tongue normal; teeth and gums normal; oropharynx moist CV: RRR Lungs: CTAB Back: no CVA tenderness; FROM at waist Abdomen: obese, soft, non-tender GU: deferred Skin: warm and dry Psychological: alert and cooperative; normal mood and affect.  Results for orders placed or performed during the hospital encounter of 10/14/18  POCT urinalysis dip (device)  Result Value Ref Range   Glucose, UA NEGATIVE NEGATIVE mg/dL   Bilirubin Urine NEGATIVE NEGATIVE   Ketones, ur NEGATIVE NEGATIVE mg/dL   Specific Gravity, Urine >=1.030 1.005 - 1.030   Hgb urine dipstick TRACE (A) NEGATIVE   pH 5.5 5.0 - 8.0   Protein, ur NEGATIVE NEGATIVE mg/dL   Urobilinogen, UA 0.2 0.0 - 1.0 mg/dL   Nitrite NEGATIVE NEGATIVE   Leukocytes,Ua NEGATIVE NEGATIVE  Pregnancy, urine POC  Result Value Ref Range   Preg Test, Ur NEGATIVE NEGATIVE    Labs Reviewed  POCT URINALYSIS DIP (DEVICE) - Abnormal; Notable for the following components:      Result Value   Hgb urine dipstick TRACE (*)  All other components within normal limits  HCG, QUANTITATIVE, PREGNANCY  POC URINE PREG, ED  POCT PREGNANCY, URINE  CERVICOVAGINAL ANCILLARY ONLY    Allergies  Allergen Reactions  . Peanut-Containing Drug Products Itching and Rash    All nuts    Past Medical History:  Diagnosis Date  . No pertinent past medical history   . S/P cesarean section 08/26/2011   Family History   Problem Relation Age of Onset  . Healthy Mother   . Healthy Father    Social History   Socioeconomic History  . Marital status: Single    Spouse name: Not on file  . Number of children: Not on file  . Years of education: Not on file  . Highest education level: Not on file  Occupational History  . Not on file  Social Needs  . Financial resource strain: Not on file  . Food insecurity    Worry: Not on file    Inability: Not on file  . Transportation needs    Medical: Not on file    Non-medical: Not on file  Tobacco Use  . Smoking status: Never Smoker  . Smokeless tobacco: Never Used  Substance and Sexual Activity  . Alcohol use: No  . Drug use: Yes    Types: Marijuana    Comment: last time smoked  4 weeks ago   . Sexual activity: Yes    Birth control/protection: None  Lifestyle  . Physical activity    Days per week: Not on file    Minutes per session: Not on file  . Stress: Not on file  Relationships  . Social Herbalist on phone: Not on file    Gets together: Not on file    Attends religious service: Not on file    Active member of club or organization: Not on file    Attends meetings of clubs or organizations: Not on file    Relationship status: Not on file  . Intimate partner violence    Fear of current or ex partner: Not on file    Emotionally abused: Not on file    Physically abused: Not on file    Forced sexual activity: Not on file  Other Topics Concern  . Not on file  Social History Narrative  . Not on file          Vanessa Kick, MD 10/14/18 [place]ly [place]

## 2018-10-14 NOTE — Discharge Instructions (Addendum)
We will call you tomorrow with the results of your blood pregnancy test.  We have sent testing for sexually transmitted infections. We will notify you of any positive results once they are received. If required, we will prescribe any medications you might need.  Please refrain from all sexual activity for at least the next seven days.

## 2018-10-15 ENCOUNTER — Telehealth (HOSPITAL_COMMUNITY): Payer: Self-pay | Admitting: Emergency Medicine

## 2018-10-15 LAB — CERVICOVAGINAL ANCILLARY ONLY
Bacterial vaginitis: POSITIVE — AB
Candida vaginitis: NEGATIVE
Chlamydia: NEGATIVE
Neisseria Gonorrhea: NEGATIVE
Trichomonas: POSITIVE — AB

## 2018-10-15 NOTE — Telephone Encounter (Signed)
Blood test negative. Patient contacted and made aware of all results, all questions answered.

## 2018-10-16 ENCOUNTER — Telehealth: Payer: Self-pay | Admitting: Emergency Medicine

## 2018-10-16 MED ORDER — METRONIDAZOLE 500 MG PO TABS: 500.0000 mg | ORAL_TABLET | Freq: Two times a day (BID) | ORAL | 0 refills | Status: AC

## 2018-10-16 NOTE — Telephone Encounter (Signed)
Trichomonas is positive. Rx  for Flagy was sent to the pharmacy of record. Pt needs education to refrain from sexual intercourse for 7 days to give the medicine time to work. Sexual partners need to be notified and tested/treated. Condoms may reduce risk of reinfection. Recheck for further evaluation if symptoms are not improving.   Bacterial vaginosis is positive. This was not treated at the urgent care visit.  Flagyl 500 mg BID x 7 days #14 no refills sent to patients pharmacy of choice.    Patient contacted and made aware of all results, all questions answered.

## 2018-12-21 ENCOUNTER — Other Ambulatory Visit: Payer: Self-pay

## 2018-12-21 ENCOUNTER — Inpatient Hospital Stay (HOSPITAL_COMMUNITY)
Admission: AD | Admit: 2018-12-21 | Discharge: 2018-12-22 | Disposition: A | Discharge status: Home / Self Care | Payer: Medicaid Other | Attending: Obstetrics & Gynecology | Admitting: Obstetrics & Gynecology

## 2018-12-21 DIAGNOSIS — N898 Other specified noninflammatory disorders of vagina: Secondary | ICD-10-CM | POA: Insufficient documentation

## 2018-12-21 DIAGNOSIS — Z789 Other specified health status: Secondary | ICD-10-CM

## 2018-12-21 DIAGNOSIS — R109 Unspecified abdominal pain: Secondary | ICD-10-CM | POA: Insufficient documentation

## 2018-12-21 LAB — POCT PREGNANCY, URINE: Preg Test, Ur: NEGATIVE

## 2018-12-21 NOTE — MAU Note (Addendum)
Reports feeling cramping that started mid Friday night-mostly on her left side.  Also having clear watery discharge without odor.  States she had a positive UPT on Monday.  LMP 12/04/2018 but said it was light pink and not like her normal period.

## 2018-12-22 ENCOUNTER — Emergency Department (HOSPITAL_COMMUNITY): Payer: Self-pay

## 2018-12-22 ENCOUNTER — Ambulatory Visit
Admission: EM | Admit: 2018-12-22 | Discharge: 2018-12-22 | Disposition: A | Discharge status: Home / Self Care | Payer: Medicaid Other | Attending: Emergency Medicine | Admitting: Emergency Medicine

## 2018-12-22 ENCOUNTER — Encounter (HOSPITAL_COMMUNITY): Payer: Self-pay

## 2018-12-22 ENCOUNTER — Emergency Department (HOSPITAL_COMMUNITY)
Admission: EM | Admit: 2018-12-22 | Discharge: 2018-12-23 | Disposition: A | Discharge status: Home / Self Care | Payer: Self-pay | Attending: Emergency Medicine | Admitting: Emergency Medicine

## 2018-12-22 ENCOUNTER — Other Ambulatory Visit: Payer: Self-pay

## 2018-12-22 DIAGNOSIS — R112 Nausea with vomiting, unspecified: Secondary | ICD-10-CM | POA: Insufficient documentation

## 2018-12-22 DIAGNOSIS — R1032 Left lower quadrant pain: Secondary | ICD-10-CM | POA: Insufficient documentation

## 2018-12-22 LAB — COMPREHENSIVE METABOLIC PANEL
ALT: 19 U/L (ref 0–44)
AST: 18 U/L (ref 15–41)
Albumin: 3.8 g/dL (ref 3.5–5.0)
Alkaline Phosphatase: 54 U/L (ref 38–126)
Anion gap: 9 (ref 5–15)
BUN: 7 mg/dL (ref 6–20)
CO2: 25 mmol/L (ref 22–32)
Calcium: 9.2 mg/dL (ref 8.9–10.3)
Chloride: 104 mmol/L (ref 98–111)
Creatinine, Ser: 0.72 mg/dL (ref 0.44–1.00)
GFR calc Af Amer: >60 mL/min (ref >60)
GFR calc non Af Amer: >60 mL/min (ref >60)
Glucose, Bld: 96 mg/dL (ref 70–99)
Potassium: 3.6 mmol/L (ref 3.5–5.1)
Sodium: 138 mmol/L (ref 135–145)
Total Bilirubin: 0.4 mg/dL (ref 0.3–1.2)
Total Protein: 7.5 g/dL (ref 6.5–8.1)

## 2018-12-22 LAB — I-STAT BETA HCG BLOOD, ED (MC, WL, AP ONLY): I-stat hCG, quantitative: <5 m[IU]/mL (ref <5)

## 2018-12-22 LAB — POCT URINALYSIS DIP (MANUAL ENTRY)
Bilirubin, UA: NEGATIVE
Blood, UA: NEGATIVE
Glucose, UA: NEGATIVE mg/dL
Ketones, POC UA: NEGATIVE mg/dL
Leukocytes, UA: NEGATIVE
Nitrite, UA: NEGATIVE
Protein Ur, POC: NEGATIVE mg/dL
Spec Grav, UA: 1.02 (ref 1.010–1.025)
Urobilinogen, UA: 0.2 E.U./dL
pH, UA: 8.5 — AB (ref 5.0–8.0)

## 2018-12-22 LAB — URINALYSIS, ROUTINE W REFLEX MICROSCOPIC
Bilirubin Urine: NEGATIVE
Glucose, UA: NEGATIVE mg/dL
Hgb urine dipstick: NEGATIVE
Ketones, ur: NEGATIVE mg/dL
Nitrite: NEGATIVE
Protein, ur: NEGATIVE mg/dL
Specific Gravity, Urine: 1.015 (ref 1.005–1.030)
pH: 6 (ref 5.0–8.0)

## 2018-12-22 LAB — CBC
HCT: 36.9 % (ref 36.0–46.0)
HCT: 39.3 % (ref 36.0–46.0)
Hemoglobin: 11.8 g/dL — ABNORMAL LOW (ref 12.0–15.0)
Hemoglobin: 12.3 g/dL (ref 12.0–15.0)
MCH: 28.9 pg (ref 26.0–34.0)
MCH: 28.5 pg (ref 26.0–34.0)
MCHC: 32 g/dL (ref 30.0–36.0)
MCHC: 31.3 g/dL (ref 30.0–36.0)
MCV: 90.4 fL (ref 80.0–100.0)
MCV: 91.2 fL (ref 80.0–100.0)
Platelets: 172 10*3/uL (ref 150–400)
Platelets: 196 10*3/uL (ref 150–400)
RBC: 4.08 MIL/uL (ref 3.87–5.11)
RBC: 4.31 MIL/uL (ref 3.87–5.11)
RDW: 15.4 % (ref 11.5–15.5)
RDW: 15.6 % — ABNORMAL HIGH (ref 11.5–15.5)
WBC: 9.9 10*3/uL (ref 4.0–10.5)
WBC: 13.4 10*3/uL — ABNORMAL HIGH (ref 4.0–10.5)
nRBC: 0 % (ref 0.0–0.2)
nRBC: 0 % (ref 0.0–0.2)

## 2018-12-22 LAB — HCG, QUANTITATIVE, PREGNANCY: hCG, Beta Chain, Quant, S: <1 m[IU]/mL (ref <5)

## 2018-12-22 LAB — LIPASE, BLOOD: Lipase: 37 U/L (ref 11–51)

## 2018-12-22 MED ORDER — ONDANSETRON HCL 4 MG/2ML IJ SOLN
4.0000 mg | Freq: Once | INTRAMUSCULAR | Status: AC
  Administered 2018-12-22: 23:00:00 4 mg via INTRAVENOUS
  Filled 2018-12-22: qty 2

## 2018-12-22 MED ORDER — IOHEXOL 300 MG/ML  SOLN
125.0000 mL | Freq: Once | INTRAMUSCULAR | Status: AC | PRN
  Administered 2018-12-22: 125 mL via INTRAVENOUS

## 2018-12-22 MED ORDER — SODIUM CHLORIDE 0.9 % IV BOLUS
1000.0000 mL | Freq: Once | INTRAVENOUS | Status: AC
  Administered 2018-12-22: 23:00:00 1000 mL via INTRAVENOUS

## 2018-12-22 MED ORDER — MORPHINE SULFATE (PF) 4 MG/ML IV SOLN
4.0000 mg | Freq: Once | INTRAVENOUS | Status: AC
  Administered 2018-12-22: 23:00:00 4 mg via INTRAVENOUS
  Filled 2018-12-22: qty 1

## 2018-12-22 MED ORDER — METRONIDAZOLE 500 MG PO TABS
2000.0000 mg | ORAL_TABLET | Freq: Once | ORAL | Status: AC
  Administered 2018-12-22: 11:00:00 2000 mg via ORAL

## 2018-12-22 MED ORDER — ONDANSETRON 4 MG PO TBDP: 4.0000 mg | ORAL_TABLET | Freq: Three times a day (TID) | ORAL | 0 refills | Status: DC | PRN

## 2018-12-22 NOTE — ED Triage Notes (Signed)
Pt c/o LLQ pain radiating around to lower back area since last Thursday. States was seen at [facility] last night for vomiting x1 each morning and discharge from breast. States had a negative pregnancy test but unable to treat her for the abdominal pain d/t she isn't pregnant. Denies urine difficulties

## 2018-12-22 NOTE — ED Triage Notes (Signed)
Pt arrives POV for eval of LLQ abd pain onset Saturday, was [facility] and [facility] today for same. Was treated with abx at [facility] for "infection" but stated that if it didn't get better with the pills to come back. Pt had neg preg test at [facility], states she also has nausea only in the AM.

## 2018-12-22 NOTE — MAU Provider Note (Signed)
First Provider Initiated Contact with Patient 12/22/18 0105     S Ms. [name] is a 33 y.o. [phone] non-pregnant female who presents to MAU today with complaint of abdominal cramping and vaginal discharge. She reports symptoms started occurring Friday. Reports having a +HPT on Monday of last week.   O BP 118/62   Pulse 89   Temp 98.8 F (37.1 C)   Resp 17   Wt (!) 159.3 kg   BMI 51.86 kg/m  Physical Exam  Nursing note and vitals reviewed. Constitutional: She is oriented to person, place, and time. She appears well-developed and well-nourished. No distress.  Cardiovascular: Normal rate and regular rhythm.  Respiratory: Effort normal and breath sounds normal. No respiratory distress. She has no wheezes. She has no rales.  Musculoskeletal: Normal range of motion.  Neurological: She is alert and oriented to person, place, and time.  Skin: Skin is warm and dry.  Psychiatric: She has a normal mood and affect. Her behavior is normal.   Results for orders placed or performed during the hospital encounter of 12/21/18 (from the past 24 hour(s))  Pregnancy, urine POC     Status: None   Collection Time: 12/21/18 11:23 PM  Result Value Ref Range   Preg Test, Ur NEGATIVE NEGATIVE  hCG, quantitative, pregnancy     Status: None   Collection Time: 12/21/18 11:55 PM  Result Value Ref Range   hCG, Beta Chain, Quant, S <1 <5 mIU/mL    A Non pregnant female Medical screening exam complete  P Discharge from MAU in stable condition Patient given the option of transfer to [facility] for further evaluation or seek care in outpatient facility of choice List of options for follow-up given  Patient may return to MAU as needed for pregnancy related complaints  Lajean Manes, CNM 12/22/2018 1:14 AM

## 2018-12-22 NOTE — ED Provider Notes (Signed)
[facility] Provider Note   CSN: [id number] Arrival date & time: 12/22/18  2040     History   Chief Complaint Chief Complaint  Patient presents with  . Abdominal Pain    HPI [place] PonderShelley S Gatt is a 33 y.o. female.     The history is provided by the patient and medical records.  Abdominal Pain Associated symptoms: nausea and vomiting      32 y.o. F presenting to the ED with LLQ abdominal pain for the past 3 days.  States initially it was mild but has been steadily worsening.  [facility], breast tenderness, and some nipple discharge.  She had a negative evaluation there.  States pain worsened so she went to urgent care earlier today.  She did receive treatment as her vaginal swabs revealed trichomonas.  States it got a little better today but then worsened again.  She has been having nausea, vomiting, and decreased appetite.  No fevers.  No urinary symptoms.  She denies vaginal discharge or pelvic pain.    Past Medical History:  Diagnosis Date  . No pertinent past medical history   . S/P cesarean section 08/26/2011    Patient Active Problem List   Diagnosis Date Noted  . S/P cesarean section 08/26/2011    Past Surgical History:  Procedure Laterality Date  . CESAREAN SECTION     Breech, 2nd FTD  . CESAREAN SECTION  08/23/2011   Procedure: CESAREAN SECTION;  Surgeon: Bing Plumehomas F Henley, MD;  Location: [facility];  Service: Gynecology;  Laterality: N/A;  Repeat     OB History    Gravida  4   Para  4   Term  4   Preterm      AB      Living  4     SAB      TAB      Ectopic      Multiple      Live Births  4            Home Medications    Prior to Admission medications   Medication Sig Start Date End Date Taking? Authorizing Provider  ondansetron (ZOFRAN ODT) 4 MG disintegrating tablet Take 1 tablet (4 mg total) by mouth every 8 (eight) hours as needed  for nausea or vomiting. 12/22/18   Hall-Potvin, [place], PA-C    Family History Family History  Problem Relation Age of Onset  . Healthy Mother   . Healthy Father     Social History Social History   Tobacco Use  . Smoking status: Never Smoker  . Smokeless tobacco: Never Used  Substance Use Topics  . Alcohol use: No  . Drug use: Yes    Types: Marijuana    Comment: last time smoked  4 weeks ago      Allergies   Peanut-containing drug products   Review of Systems Review of Systems  Gastrointestinal: Positive for abdominal pain, nausea and vomiting.  All other systems reviewed and are negative.    Physical Exam Updated Vital Signs BP 121/82 (BP Location: Right Arm)   Pulse (!) 118   Temp 98.9 F (37.2 C) (Oral)   Resp 18   Ht 5\' 9"  (1.753 m)   Wt (!) 159 kg   LMP 11/28/2018   SpO2 97%   BMI 51.76 kg/m   Physical Exam Vitals signs and nursing note reviewed.  Constitutional:  Appearance: She is well-developed.     Comments: Obese, appears uncomfortable, tearful  HENT:     Head: Normocephalic and atraumatic.  Eyes:     Conjunctiva/sclera: Conjunctivae normal.     Pupils: Pupils are equal, round, and reactive to light.  Neck:     Musculoskeletal: Normal range of motion.  Cardiovascular:     Rate and Rhythm: Normal rate and regular rhythm.     Heart sounds: Normal heart sounds.  Pulmonary:     Effort: Pulmonary effort is normal.     Breath sounds: Normal breath sounds.  Abdominal:     General: Bowel sounds are normal.     Palpations: Abdomen is soft.     Tenderness: There is abdominal tenderness in the left lower quadrant.       Comments: Obese abdomen Tender mostly in LLQ and a little suprapubically, no pelvic tenderness  Musculoskeletal: Normal range of motion.  Skin:    General: Skin is warm and dry.  Neurological:     Mental Status: She is alert and oriented to person, place, and time.      ED Treatments / Results  Labs (all labs  ordered are listed, but only abnormal results are displayed) Labs Reviewed  CBC - Abnormal; Notable for the following components:      Result Value   WBC 13.4 (*)    RDW 15.6 (*)    All other components within normal limits  URINALYSIS, ROUTINE W REFLEX MICROSCOPIC - Abnormal; Notable for the following components:   APPearance CLOUDY (*)    Leukocytes,Ua TRACE (*)    Bacteria, UA RARE (*)    All other components within normal limits  LIPASE, BLOOD  COMPREHENSIVE METABOLIC PANEL  I-STAT BETA HCG BLOOD, ED ([facility], [facility], [facility] ONLY)    EKG None  Radiology Ct Abdomen Pelvis W Contrast  Result Date: 12/22/2018 CLINICAL DATA:  Left lower quadrant abdominal pain. EXAM: CT ABDOMEN AND PELVIS WITH CONTRAST TECHNIQUE: Multidetector CT imaging of the abdomen and pelvis was performed using the standard protocol following bolus administration of intravenous contrast. CONTRAST:  OMNIPAQUE IOHEXOL 300 MG/ML  SOLN COMPARISON:  None. FINDINGS: Lower chest: The visualized heart size within normal limits. No pericardial fluid/thickening. No hiatal hernia. The visualized portions of the lungs are clear. Hepatobiliary: The liver is normal in density without focal abnormality.The main portal vein is patent. No evidence of calcified gallstones, gallbladder wall thickening or biliary dilatation. Pancreas: Unremarkable. No pancreatic ductal dilatation or surrounding inflammatory changes. Spleen: Normal in size without focal abnormality. Adrenals/Urinary Tract: Both adrenal glands appear normal. Bilateral nonobstructing renal calculi are seen. The largest measuring 4 mm in lower pole of the left kidney and 2 mm in lower pole of the right kidney. The bladder is unremarkable. Stomach/Bowel: The stomach, small bowel, and colon are normal in appearance. No inflammatory changes, wall thickening, or obstructive findings.The appendix is normal. Vascular/Lymphatic: There are no enlarged mesenteric, retroperitoneal, or pelvic  lymph nodes. No significant vascular findings are present. Reproductive: A small amount of fluid seen in the endometrial canal. A small amount of fluid in the cul-de-sac. Other: No evidence of abdominal wall mass or hernia. Musculoskeletal: No acute or significant osseous findings. IMPRESSION: Bilateral non-obstructing renal calculi. No other acute intra-abdominal or pelvic pathology to explain the patient's symptoms. Electronically Signed   By: Jonna Clark M.D.   On: 12/22/2018 23:57    Procedures Procedures (including critical care time)  Medications Ordered in ED Medications - No data to  display   Initial Impression / Assessment and Plan / ED Course  I have reviewed the triage vital signs and the nursing notes.  Pertinent labs & imaging results that were available during my care of the patient were reviewed by me and considered in my medical decision making (see chart for details).  34 year old female here with left lower abdominal pain.  Has been seen at [facility] as well as urgent care for same.  She had pelvic evaluation and tested positive for trichomonas but negative for gonorrhea and chlamydia.  She denies any urinary symptoms.  Pain remains localized to left lower abdomen.  She reports some nausea and vomiting.  She is afebrile and nontoxic.    Some tenderness in the left lower abdomen and mildly in the suprapubic region.  No peritoneal signs.  Labs are overall reassuring, mild leukocytosis at 13,000, otherwise reassuring.  CT scan without any acute findings, specifically no findings of diverticulitis or findings of TOA or other fluid collection in the pelvis.  Patient resting comfortably here, VSS.  Feel she stable for discharge home.  We will have her follow-up with primary care doctor.  Return here for any new or acute changes.  Final Clinical Impressions(s) / ED Diagnoses   Final diagnoses:  Left lower quadrant abdominal pain    ED Discharge Orders         Ordered     naproxen (NAPROSYN) 500 MG tablet  2 times daily with meals     12/23/18 0050    ondansetron (ZOFRAN ODT) 4 MG disintegrating tablet  Every 8 hours PRN     12/23/18 0050           Larene Pickett, PA-C 12/23/18 0054    Ward, Delice Bison, DO 12/23/18 0104

## 2018-12-22 NOTE — ED Provider Notes (Signed)
[facility]    CSN: [id number] Arrival date & time: 12/22/18  0955      History   Chief Complaint Chief Complaint  Patient presents with  . Abdominal Pain    HPI [name] is a 33 y.o. female with history of morbid obesity presenting for left lower quadrant pain since Thursday.  It is nonradiating, without known exacerbating/alleviating factors.  Patient endorses some nausea, one episode of emesis the other day without bile or blood.  Patient tried going to [facility] last night for similar complaint, though was unable to be seen due to negative serum hCG.  Patient denies history of fibroids, ovarian cyst.  Patient is status post cesarean section x3.  Denies change in urinary or bowel habits.  Patient does endorse vaginal discharge: Denies pelvic or vaginal pain.  Of note, patient was seen in June for vaginal irritation: Records reviewed by me at time of appointment.  Tested positive for trichomonas, reports completing treatment at that time.  Patient does admit to being sexually active within the 7-day abstinence timeframe.   Past Medical History:  Diagnosis Date  . No pertinent past medical history   . S/P cesarean section 08/26/2011    Patient Active Problem List   Diagnosis Date Noted  . S/P cesarean section 08/26/2011    Past Surgical History:  Procedure Laterality Date  . CESAREAN SECTION     Breech, 2nd FTD  . CESAREAN SECTION  08/23/2011   Procedure: CESAREAN SECTION;  Surgeon: Bing Plumehomas F Henley, MD;  Location: [facility];  Service: Gynecology;  Laterality: N/A;  Repeat    OB History    Gravida  4   Para  4   Term  4   Preterm      AB      Living  4     SAB      TAB      Ectopic      Multiple      Live Births  4            Home Medications    Prior to Admission medications   Medication Sig Start Date End Date Taking? Authorizing Provider  ondansetron (ZOFRAN ODT) 4 MG disintegrating tablet Take 1 tablet (4 mg total) by  mouth every 8 (eight) hours as needed for nausea or vomiting. 12/22/18   Hall-Potvin, [place], PA-C    Family History Family History  Problem Relation Age of Onset  . Healthy Mother   . Healthy Father     Social History Social History   Tobacco Use  . Smoking status: Never Smoker  . Smokeless tobacco: Never Used  Substance Use Topics  . Alcohol use: No  . Drug use: Yes    Types: Marijuana    Comment: last time smoked  4 weeks ago      Allergies   Peanut-containing drug products   Review of Systems Review of Systems  Constitutional: Negative for fatigue and fever.  HENT: Negative for ear pain, sinus pain, sore throat and voice change.   Eyes: Negative for pain, redness and visual disturbance.  Respiratory: Negative for cough and shortness of breath.   Cardiovascular: Negative for chest pain and palpitations.  Gastrointestinal: Positive for abdominal pain and nausea. Negative for abdominal distention, blood in stool, constipation, diarrhea, rectal pain and vomiting.  Musculoskeletal: Negative for arthralgias and myalgias.  Skin: Negative for rash and wound.  Neurological: Negative for syncope and headaches.     Physical Exam  Triage Vital Signs ED Triage Vitals  Enc Vitals Group     BP 12/22/18 1004 135/83     Pulse Rate 12/22/18 1004 80     Resp 12/22/18 1004 18     Temp 12/22/18 1004 98 F (36.7 C)     Temp Source 12/22/18 1004 Oral     SpO2 12/22/18 1004 97 %     Weight --      Height --      Head Circumference --      Peak Flow --      Pain Score 12/22/18 1005 7     Pain Loc --      Pain Edu? --      Excl. in [place]? --    No data found.  Updated Vital Signs BP 135/83 (BP Location: Left Arm)   Pulse 80   Temp 98 F (36.7 C) (Oral)   Resp 18   LMP 11/28/2018   SpO2 97%   Visual Acuity Right Eye Distance:   Left Eye Distance:   Bilateral Distance:    Right Eye Near:   Left Eye Near:    Bilateral Near:     Physical Exam Constitutional:       General: She is not in acute distress.    Appearance: She is obese. She is not ill-appearing.  HENT:     Head: Normocephalic and atraumatic.  Eyes:     General: No scleral icterus.    Pupils: Pupils are equal, round, and reactive to light.  Cardiovascular:     Rate and Rhythm: Normal rate and regular rhythm.     Heart sounds: No murmur.  Pulmonary:     Effort: Pulmonary effort is normal. No respiratory distress.     Breath sounds: No wheezing.  Abdominal:     General: A surgical scar is present. Bowel sounds are normal. There is no distension or abdominal bruit. There are no signs of injury.     Palpations: Abdomen is soft. There is no shifting dullness, fluid wave, hepatomegaly, splenomegaly, mass or pulsatile mass.     Tenderness: There is abdominal tenderness in the left lower quadrant. There is no right CVA tenderness, left CVA tenderness or guarding. Negative signs include Murphy's sign.  Genitourinary:    Vagina: No foreign body. Vaginal discharge present. No tenderness or bleeding.     Cervix: No cervical motion tenderness or friability.     Adnexa:        Right: No mass or tenderness.         Left: No mass or tenderness.       Comments: Uterus difficult to appreciate second to habitus.  Moderate discomfort with deep suprapubic palpation. Skin:    Coloration: Skin is not jaundiced or pale.  Neurological:     Mental Status: She is alert and oriented to person, place, and time.      UC Treatments / Results  Labs (all labs ordered are listed, but only abnormal results are displayed) Labs Reviewed  POCT URINALYSIS DIP (MANUAL ENTRY) - Abnormal; Notable for the following components:      Result Value   Clarity, UA cloudy (*)    pH, UA 8.5 (*)    All other components within normal limits  CERVICOVAGINAL ANCILLARY ONLY    EKG   Radiology No results found.  Procedures Procedures (including critical care time)  Medications Ordered in UC Medications  metroNIDAZOLE  (FLAGYL) tablet 2,000 mg (2,000 mg Oral Given 12/22/18 1102)  Initial Impression / Assessment and Plan / UC Course  I have reviewed the triage vital signs and the nursing notes.  Pertinent labs & imaging results that were available during my care of the patient were reviewed by me and considered in my medical decision making (see chart for details).     1.  Left lower quadrant pain Urine pregnancy test deferred due to negative serum hCG on 12/21/2018 at 11:55 PM.  POCT urine dipstick done in office: Significant for pH of 8.9: Culture deferred.  Cervical vaginal ancillary swab pending: Patient treated for trichomonas today given history, discharge, suprapubic pain, concern for re-exposure.  Lower suspicion for PID at this time given negative CMT, lack of fever or pelvic pain.  Discussed strict return precautions, as well as importance of following up with PCP as she may require further gynecologic work-up.  Patient verbalized understanding, is agreeable to plan.  2.  Nausea vomiting Will trial Zofran, monitor dehydration: Emphasize importance of oral hydration with water.  Patient verbalized understanding. Final Clinical Impressions(s) / UC Diagnoses   Final diagnoses:  LLQ pain  Nausea and vomiting, intractability of vomiting not specified, unspecified vomiting type     Discharge Instructions     Your treated today for trichomonas We will call you if chlamydia or gonorrhea comes back positive and treat at that time. Monitor symptoms: May take ibuprofen, Tylenol as needed for pain. Go to ER for further evaluation if you have continued/worsening pain over the next 24 hours, fever, worsening vomiting.    ED Prescriptions    Medication Sig Dispense Auth. Provider   ondansetron (ZOFRAN ODT) 4 MG disintegrating tablet Take 1 tablet (4 mg total) by mouth every 8 (eight) hours as needed for nausea or vomiting. 20 tablet Hall-Potvin, [place], PA-C     Controlled Substance Prescriptions [place]  Controlled Substance Registry consulted? Not Applicable   Shea Evans, [place] 12/22/18 4801

## 2018-12-22 NOTE — Discharge Instructions (Signed)
Your treated today for trichomonas We will call you if chlamydia or gonorrhea comes back positive and treat at that time. Monitor symptoms: May take ibuprofen, Tylenol as needed for pain. Go to ER for further evaluation if you have continued/worsening pain over the next 24 hours, fever, worsening vomiting.

## 2018-12-23 MED ORDER — ONDANSETRON 4 MG PO TBDP: 4.0000 mg | ORAL_TABLET | Freq: Three times a day (TID) | ORAL | 0 refills | Status: DC | PRN

## 2018-12-23 MED ORDER — NAPROXEN 500 MG PO TABS: 500.0000 mg | ORAL_TABLET | Freq: Two times a day (BID) | ORAL | 0 refills | Status: DC

## 2018-12-23 NOTE — Discharge Instructions (Signed)
CT today was normal. Medications sent to your pharmacy to help with symptoms.  Recommend good oral hydration, gentle diet and progress back to normal as tolerated. Follow-up with your primary care doctor. Return here for any new/acute changes.

## 2018-12-25 LAB — CERVICOVAGINAL ANCILLARY ONLY
Chlamydia: NEGATIVE
Neisseria Gonorrhea: NEGATIVE
Trichomonas: NEGATIVE

## 2019-05-18 ENCOUNTER — Ambulatory Visit (INDEPENDENT_AMBULATORY_CARE_PROVIDER_SITE_OTHER): Payer: Medicaid Other | Admitting: Certified Nurse Midwife

## 2019-05-18 ENCOUNTER — Encounter: Payer: Self-pay | Admitting: Certified Nurse Midwife

## 2019-05-18 ENCOUNTER — Other Ambulatory Visit (HOSPITAL_COMMUNITY)
Admission: RE | Admit: 2019-05-18 | Discharge: 2019-05-18 | Disposition: A | Discharge status: Home / Self Care | Payer: Medicaid Other | Source: Ambulatory Visit | Attending: Certified Nurse Midwife | Admitting: Certified Nurse Midwife

## 2019-05-18 ENCOUNTER — Ambulatory Visit: Payer: Medicaid Other | Admitting: Certified Nurse Midwife

## 2019-05-18 ENCOUNTER — Other Ambulatory Visit: Payer: Self-pay

## 2019-05-18 VITALS — BP 119/77 | HR 83 | Ht 69.0 in | Wt 299.0 lb

## 2019-05-18 DIAGNOSIS — Z01419 Encounter for gynecological examination (general) (routine) without abnormal findings: Secondary | ICD-10-CM

## 2019-05-18 DIAGNOSIS — Z3009 Encounter for other general counseling and advice on contraception: Secondary | ICD-10-CM

## 2019-05-18 DIAGNOSIS — E049 Nontoxic goiter, unspecified: Secondary | ICD-10-CM

## 2019-05-18 DIAGNOSIS — Z0000 Encounter for general adult medical examination without abnormal findings: Secondary | ICD-10-CM

## 2019-05-18 DIAGNOSIS — Z3202 Encounter for pregnancy test, result negative: Secondary | ICD-10-CM | POA: Diagnosis not present

## 2019-05-18 DIAGNOSIS — Z113 Encounter for screening for infections with a predominantly sexual mode of transmission: Secondary | ICD-10-CM | POA: Diagnosis not present

## 2019-05-18 DIAGNOSIS — N760 Acute vaginitis: Secondary | ICD-10-CM

## 2019-05-18 DIAGNOSIS — B9689 Other specified bacterial agents as the cause of diseases classified elsewhere: Secondary | ICD-10-CM

## 2019-05-18 DIAGNOSIS — Z30011 Encounter for initial prescription of contraceptive pills: Secondary | ICD-10-CM

## 2019-05-18 LAB — POCT URINE PREGNANCY: Preg Test, Ur: NEGATIVE

## 2019-05-18 MED ORDER — NORGESTIMATE-ETH ESTRADIOL 0.25-35 MG-MCG PO TABS: 1.0000 | ORAL_TABLET | Freq: Every day | ORAL | 3 refills | Status: DC

## 2019-05-18 MED ORDER — METRONIDAZOLE 500 MG PO TABS: 500.0000 mg | ORAL_TABLET | Freq: Two times a day (BID) | ORAL | 0 refills | Status: DC

## 2019-05-18 NOTE — Progress Notes (Signed)
GYNECOLOGY ANNUAL PREVENTATIVE CARE ENCOUNTER NOTE  History:     [name] is a 34 y.o. [phone] female here for a routine annual gynecologic exam.  Current complaints: vaginal discharge and birth control.   Denies abnormal vaginal bleeding, pelvic pain, problems with intercourse or other gynecologic concerns. Patient request STD screening    Gynecologic History Patient's last menstrual period was 04/22/2019 (exact date). Contraception: none Last Pap: unsure of specific date- pt reports more than 3 years ago. Results were: normal with negative HPV  Obstetric History OB History  Gravida Para Term Preterm AB Living  4 4 4     4   SAB TAB Ectopic Multiple Live Births          4    # Outcome Date GA Lbr Len/2nd Weight Sex Delivery Anes PTL Lv  4 Term 08/23/11 [redacted]w[redacted]d  8 lb 8.2 oz (3.86 kg) F CS-LTranv Spinal  LIV  3 Term      CS-LTranv     2 Term      CS-LTranv     1 Term      Vag-Spont       Past Medical History:  Diagnosis Date  . No pertinent past medical history   . S/P cesarean section 08/26/2011    Past Surgical History:  Procedure Laterality Date  . CESAREAN SECTION     Breech, 2nd FTD  . CESAREAN SECTION  08/23/2011   Procedure: CESAREAN SECTION;  Surgeon: Melina Schools, MD;  Location: [facility];  Service: Gynecology;  Laterality: N/A;  Repeat    Current Outpatient Medications on File Prior to Visit  Medication Sig Dispense Refill  . naproxen (NAPROSYN) 500 MG tablet Take 1 tablet (500 mg total) by mouth 2 (two) times daily with a meal. 30 tablet 0  . ondansetron (ZOFRAN ODT) 4 MG disintegrating tablet Take 1 tablet (4 mg total) by mouth every 8 (eight) hours as needed for nausea. 10 tablet 0   No current facility-administered medications on file prior to visit.    Allergies  Allergen Reactions  . Peanut-Containing Drug Products Itching and Rash    All nuts    Social History:  reports that she has never smoked. She has never used smokeless tobacco. She  reports current drug use. Drug: Marijuana. She reports that she does not drink alcohol.  Family History  Problem Relation Age of Onset  . Healthy Mother   . Healthy Father     The following portions of the patient's history were reviewed and updated as appropriate: allergies, current medications, past family history, past medical history, past social history, past surgical history and problem list.  Review of Systems Pertinent items noted in HPI and remainder of comprehensive ROS otherwise negative.  Physical Exam:  BP 119/77   Pulse 83   Ht 5\' 9"  (1.753 m)   Wt 299 lb (135.6 kg)   LMP 04/22/2019 (Exact Date)   BMI 44.15 kg/m  CONSTITUTIONAL: Well-developed, obese female in no acute distress.  HENT:  Normocephalic, atraumatic, External right and left ear normal. Oropharynx is clear and moist EYES: Conjunctivae and EOM are normal. Pupils are equal, round, and reactive to light.  NECK: Normal range of motion, supple, no masses.  Enlarged thyroid.  SKIN: Skin is warm and dry. No rash noted. Not diaphoretic. No erythema. No pallor. MUSCULOSKELETAL: Normal range of motion. No tenderness.  No cyanosis, clubbing, or edema.  2+ distal pulses. NEUROLOGIC: Alert and oriented to person, place, and  time. Normal reflexes, muscle tone coordination.  PSYCHIATRIC: Normal mood and affect. Normal behavior. Normal judgment and thought content. CARDIOVASCULAR: Normal heart rate noted, regular rhythm RESPIRATORY: Clear to auscultation bilaterally. Effort and breath sounds normal, no problems with respiration noted. BREASTS: Symmetric in size. No masses, tenderness, skin changes, nipple drainage, or lymphadenopathy bilaterally. ABDOMEN: Soft, no distention noted.  No tenderness, rebound or guarding.  PELVIC: Normal appearing external genitalia and urethral meatus; normal appearing vaginal mucosa and cervix. Moderate amount of white thin discharge without odor.  Pap smear obtained.  Normal uterine size, no  other palpable masses, no uterine or adnexal tenderness.   Assessment and Plan:    1. Women's annual routine gynecological examination - Normal well woman examination except enlarged thyroid  - Cytology - PAP( [facility]) - HgB A1c  2. Birth control counseling - Educated and discussed birth control options with patient - Patient reports hx of AUB prior to being on birth control  - Has been on nexplanon in past and would like OCPs   3. Screening for STDs (sexually transmitted diseases) - Patient request STD screening  - HIV Antibody (routine testing w rflx) - Hepatitis B Surface AntiGEN - Hepatitis C Antibody - RPR  4. Enlarged thyroid - Thyroid enlarged on examination  - Patient declines fam hx of personal hx of thyroid disease  - TSH  5. Encounter for initial prescription of contraceptive pills - UPT negative in office today  - Patient reports unprotected IC in past two weeks but taking plan B within 24 hours of intercourse.  - LMP 1/6, ovulation week of 1/18, IC on 1/27, plan B on 1/28 - unlikely of conception  - next cycle due to start this week - encouraged patient to start taking medication on last of menstrual bleeding to decrease chance of irregular bleeding, patient verbalizes understanding  - Encouraged to use back up contraception for 1 week after initiation  - norgestimate-ethinyl estradiol (ORTHO-CYCLEN) 0.25-35 MG-MCG tablet; Take 1 tablet by mouth daily.  Dispense: 3 Package; Refill: 3  6. Bacterial vaginosis - Will treat based on clinical symptoms and pelvic examination  - Patient reports recent use of different soap d/t store being out of usual type, now has correct soap but after vaginal discharge started  - metroNIDAZOLE (FLAGYL) 500 MG tablet; Take 1 tablet (500 mg total) by mouth 2 (two) times daily.  Dispense: 14 tablet; Refill: 0  Will follow up results of pap smear/STD screening and manage accordingly. Routine preventative health maintenance measures  emphasized. Please refer to After Visit Summary for other counseling recommendations.      Sharyon Cable, CNM [facility] [employer]

## 2019-05-18 NOTE — Progress Notes (Signed)
NGYN patient presents for Annual Exam   [facility] has been more than 3 yrs.  LMP: 04/22/19 Cycles are monthly lasting 10 days. Contraception: recently had nexplanon( had for 3 yrs )  taken out no Birth Control at this time. Wants to discuss.  Pt has had unprotected intercourse in last 2 weeks.  Family Hx of Breast Cancer: None   STD Screening: Desires   CC: vaginal discharge   UPT : NEGATIVE

## 2019-05-19 LAB — TSH: TSH: 0.656 u[IU]/mL (ref 0.450–4.500)

## 2019-05-19 LAB — HEPATITIS C ANTIBODY: Hep C Virus Ab: <0.1 s/co ratio (ref 0.0–0.9)

## 2019-05-19 LAB — HEMOGLOBIN A1C
Est. average glucose Bld gHb Est-mCnc: 108 mg/dL
Hgb A1c MFr Bld: 5.4 % (ref 4.8–5.6)

## 2019-05-19 LAB — RPR: RPR Ser Ql: NONREACTIVE

## 2019-05-19 LAB — HEPATITIS B SURFACE ANTIGEN: Hepatitis B Surface Ag: NEGATIVE

## 2019-05-19 LAB — HIV ANTIBODY (ROUTINE TESTING W REFLEX): HIV Screen 4th Generation wRfx: NONREACTIVE

## 2019-05-20 LAB — CYTOLOGY - PAP
Chlamydia: NEGATIVE
Comment: NEGATIVE
Comment: NEGATIVE
Comment: NORMAL
Diagnosis: NEGATIVE
High risk HPV: NEGATIVE
Neisseria Gonorrhea: NEGATIVE

## 2019-12-11 ENCOUNTER — Other Ambulatory Visit (HOSPITAL_COMMUNITY)
Admission: RE | Admit: 2019-12-11 | Discharge: 2019-12-11 | Disposition: A | Discharge status: Home / Self Care | Payer: Medicaid Other | Source: Ambulatory Visit | Attending: Obstetrics and Gynecology | Admitting: Obstetrics and Gynecology

## 2019-12-11 ENCOUNTER — Ambulatory Visit (INDEPENDENT_AMBULATORY_CARE_PROVIDER_SITE_OTHER): Payer: Medicaid Other

## 2019-12-11 ENCOUNTER — Other Ambulatory Visit: Payer: Self-pay

## 2019-12-11 DIAGNOSIS — Z3202 Encounter for pregnancy test, result negative: Secondary | ICD-10-CM

## 2019-12-11 DIAGNOSIS — Z3041 Encounter for surveillance of contraceptive pills: Secondary | ICD-10-CM

## 2019-12-11 DIAGNOSIS — N898 Other specified noninflammatory disorders of vagina: Secondary | ICD-10-CM

## 2019-12-11 DIAGNOSIS — Z3200 Encounter for pregnancy test, result unknown: Secondary | ICD-10-CM

## 2019-12-11 LAB — POCT URINE PREGNANCY: Preg Test, Ur: NEGATIVE

## 2019-12-11 NOTE — Progress Notes (Signed)
Pt is in the office for self swab today, reporting vaginal discharge. Pt also states possibility of pregnancy, LMP unknown but had a cycle in July, UPT is negative today. Advised pt if cycle does not come, repeat UPT at home and follow up with provider if needed, pt agreed.

## 2019-12-11 NOTE — Progress Notes (Signed)
Patient was assessed and managed by nursing staff during this encounter. I have reviewed the chart and agree with the documentation and plan. I have also made any necessary editorial changes.  Warden Fillers, MD 12/11/2019 11:15 AM

## 2019-12-14 ENCOUNTER — Other Ambulatory Visit: Payer: Self-pay | Admitting: *Deleted

## 2019-12-14 DIAGNOSIS — B9689 Other specified bacterial agents as the cause of diseases classified elsewhere: Secondary | ICD-10-CM

## 2019-12-14 LAB — CERVICOVAGINAL ANCILLARY ONLY
Bacterial Vaginitis (gardnerella): POSITIVE — AB
Candida Glabrata: NEGATIVE
Candida Vaginitis: NEGATIVE
Chlamydia: NEGATIVE
Comment: NEGATIVE
Comment: NEGATIVE
Comment: NEGATIVE
Comment: NEGATIVE
Comment: NEGATIVE
Comment: NORMAL
Neisseria Gonorrhea: NEGATIVE
Trichomonas: NEGATIVE

## 2019-12-14 MED ORDER — METRONIDAZOLE 500 MG PO TABS: 500.0000 mg | ORAL_TABLET | Freq: Two times a day (BID) | ORAL | 0 refills | Status: DC

## 2019-12-14 NOTE — Progress Notes (Signed)
Flagyl sent for +BV °See lab result °

## 2019-12-24 ENCOUNTER — Ambulatory Visit (HOSPITAL_COMMUNITY)
Admission: EM | Admit: 2019-12-24 | Discharge: 2019-12-24 | Disposition: A | Discharge status: Home / Self Care | Payer: Medicaid Other | Attending: Family Medicine | Admitting: Family Medicine

## 2019-12-24 ENCOUNTER — Encounter (HOSPITAL_COMMUNITY): Payer: Self-pay | Admitting: Emergency Medicine

## 2019-12-24 ENCOUNTER — Other Ambulatory Visit: Payer: Self-pay

## 2019-12-24 DIAGNOSIS — L0291 Cutaneous abscess, unspecified: Secondary | ICD-10-CM

## 2019-12-24 MED ORDER — CEPHALEXIN 500 MG PO CAPS: 500.0000 mg | ORAL_CAPSULE | Freq: Three times a day (TID) | ORAL | 0 refills | Status: DC

## 2019-12-24 NOTE — Discharge Instructions (Signed)
We opened up the abscess and drained.  Warm compresses Keep covered Antibiotics as prescribed Follow up as needed for continued or worsening symptoms

## 2019-12-24 NOTE — ED Provider Notes (Signed)
[facility]    CSN: [id number] Arrival date & time: 12/24/19  6387      History   Chief Complaint Chief Complaint  Patient presents with  . Abscess    HPI [name] is a 34 y.o. female.   Patient is a 34 year old female presents today with abscess to central chest area.  This is been present worsening over the past 3 to 4 days.  Denies any drainage from the area.  Denies any fever or history of similar.  No history of MRSA.     Past Medical History:  Diagnosis Date  . No pertinent past medical history   . S/P cesarean section 08/26/2011    Patient Active Problem List   Diagnosis Date Noted  . S/P cesarean section 08/26/2011    Past Surgical History:  Procedure Laterality Date  . CESAREAN SECTION     Breech, 2nd FTD  . CESAREAN SECTION  08/23/2011   Procedure: CESAREAN SECTION;  Surgeon: Bing Plume, MD;  Location: [facility];  Service: Gynecology;  Laterality: N/A;  Repeat    OB History    Gravida  4   Para  4   Term  4   Preterm      AB      Living  4     SAB      TAB      Ectopic      Multiple      Live Births  4            Home Medications    Prior to Admission medications   Medication Sig Start Date End Date Taking? Authorizing Provider  cephALEXin (KEFLEX) 500 MG capsule Take 1 capsule (500 mg total) by mouth 3 (three) times daily for 5 days. 12/24/19 12/29/19  Dahlia Byes A, NP  metroNIDAZOLE (FLAGYL) 500 MG tablet Take 1 tablet (500 mg total) by mouth 2 (two) times daily. 12/14/19   Warden Fillers, MD  norgestimate-ethinyl estradiol (ORTHO-CYCLEN) 0.25-35 MG-MCG tablet Take 1 tablet by mouth daily. Patient not taking: Reported on 12/11/2019 05/18/19 12/24/19  Sharyon Cable, CNM    Family History Family History  Problem Relation Age of Onset  . Healthy Mother   . Healthy Father     Social History Social History   Tobacco Use  . Smoking status: Never Smoker  . Smokeless tobacco: Never Used  Vaping Use  .  Vaping Use: Never used  Substance Use Topics  . Alcohol use: No  . Drug use: Yes    Types: Marijuana     Allergies   Peanut-containing drug products   Review of Systems Review of Systems   Physical Exam Triage Vital Signs ED Triage Vitals  Enc Vitals Group     BP 12/24/19 1130 114/76     Pulse Rate 12/24/19 1130 74     Resp 12/24/19 1130 18     Temp 12/24/19 1130 98.4 F (36.9 C)     Temp Source 12/24/19 1130 Oral     SpO2 12/24/19 1130 98 %     Weight --      Height --      Head Circumference --      Peak Flow --      Pain Score 12/24/19 1129 6     Pain Loc --      Pain Edu? --      Excl. in [place]? --    No data found.  Updated Vital Signs  BP 114/76 (BP Location: Right Arm)   Pulse 74   Temp 98.4 F (36.9 C) (Oral)   Resp 18   LMP 11/13/2019   SpO2 98%   Visual Acuity Right Eye Distance:   Left Eye Distance:   Bilateral Distance:    Right Eye Near:   Left Eye Near:    Bilateral Near:     Physical Exam Vitals and nursing note reviewed.  Constitutional:      General: She is not in acute distress.    Appearance: Normal appearance. She is not ill-appearing, toxic-appearing or diaphoretic.  HENT:     Head: Normocephalic.     Nose: Nose normal.  Eyes:     Conjunctiva/sclera: Conjunctivae normal.  Pulmonary:     Effort: Pulmonary effort is normal.  Chest:       Comments: Approximated 3 cm abscess to central chest area with fluctuance and surrounding erythema and induration. No drainage.  Musculoskeletal:        General: Normal range of motion.     Cervical back: Normal range of motion.  Skin:    General: Skin is warm and dry.     Findings: No rash.  Neurological:     Mental Status: She is alert.  Psychiatric:        Mood and Affect: Mood normal.      UC Treatments / Results  Labs (all labs ordered are listed, but only abnormal results are displayed) Labs Reviewed - No data to display  EKG   Radiology No results  found.  Procedures Incision and Drainage  Date/Time: 12/24/2019 11:54 AM Performed by: Janace Aris, NP Authorized by: Janace Aris, NP   Consent:    Consent obtained:  Verbal   Consent given by:  Patient   Risks discussed:  Bleeding, incomplete drainage and pain   Alternatives discussed:  No treatment Location:    Type:  Abscess   Size:  3 Pre-procedure details:    Skin preparation:  Antiseptic wash Anesthesia (see MAR for exact dosages):    Anesthesia method:  Topical application Procedure type:    Complexity:  Simple Procedure details:    Incision types:  Stab incision   Drainage:  Bloody and purulent   Drainage amount:  Copious   Packing materials:  None Post-procedure details:    Patient tolerance of procedure:  Tolerated well, no immediate complications   (including critical care time)  Medications Ordered in UC Medications - No data to display  Initial Impression / Assessment and Plan / UC Course  I have reviewed the triage vital signs and the nursing notes.  Pertinent labs & imaging results that were available during my care of the patient were reviewed by me and considered in my medical decision making (see chart for details).     Abscess I&D done here in clinic.  Patient tolerated well.  A lot of purulent drainage Dressing placed here in clinic Recommend continue warm compresses and antibiotics as prescribed. Follow up as needed for continued or worsening symptoms  Final Clinical Impressions(s) / UC Diagnoses   Final diagnoses:  Abscess     Discharge Instructions     We opened up the abscess and drained.  Warm compresses Keep covered Antibiotics as prescribed Follow up as needed for continued or worsening symptoms     ED Prescriptions    Medication Sig Dispense Auth. Provider   cephALEXin (KEFLEX) 500 MG capsule Take 1 capsule (500 mg total) by mouth 3 (three) times  daily for 5 days. 15 capsule Alonah Lineback A, NP     PDMP not reviewed  this encounter.   Dahlia Byes A, NP 12/24/19 1155

## 2019-12-24 NOTE — ED Triage Notes (Signed)
Pt presents with boil on chest, states warm to touch, hard, and painful xs 2 days.

## 2020-02-18 ENCOUNTER — Other Ambulatory Visit: Payer: Self-pay

## 2020-02-18 ENCOUNTER — Ambulatory Visit (INDEPENDENT_AMBULATORY_CARE_PROVIDER_SITE_OTHER): Payer: Medicaid Other | Admitting: Obstetrics

## 2020-02-18 ENCOUNTER — Other Ambulatory Visit (HOSPITAL_COMMUNITY)
Admission: RE | Admit: 2020-02-18 | Discharge: 2020-02-18 | Disposition: A | Discharge status: Home / Self Care | Payer: Medicaid Other | Source: Ambulatory Visit | Attending: Obstetrics | Admitting: Obstetrics

## 2020-02-18 ENCOUNTER — Encounter: Payer: Self-pay | Admitting: Obstetrics

## 2020-02-18 VITALS — BP 124/75 | HR 82 | Ht 69.0 in | Wt 309.0 lb

## 2020-02-18 DIAGNOSIS — Z30011 Encounter for initial prescription of contraceptive pills: Secondary | ICD-10-CM | POA: Diagnosis not present

## 2020-02-18 DIAGNOSIS — R102 Pelvic and perineal pain: Secondary | ICD-10-CM

## 2020-02-18 DIAGNOSIS — Z6841 Body Mass Index (BMI) 40.0 and over, adult: Secondary | ICD-10-CM | POA: Diagnosis not present

## 2020-02-18 DIAGNOSIS — Z3009 Encounter for other general counseling and advice on contraception: Secondary | ICD-10-CM | POA: Diagnosis not present

## 2020-02-18 DIAGNOSIS — N898 Other specified noninflammatory disorders of vagina: Secondary | ICD-10-CM

## 2020-02-18 LAB — POCT URINALYSIS DIPSTICK
Bilirubin, UA: NEGATIVE
Blood, UA: NEGATIVE
Glucose, UA: NEGATIVE
Ketones, UA: NEGATIVE
Leukocytes, UA: NEGATIVE
Nitrite, UA: NEGATIVE
Protein, UA: NEGATIVE
Spec Grav, UA: 1.01 (ref 1.010–1.025)
Urobilinogen, UA: 0.2 E.U./dL
pH, UA: 7.5 (ref 5.0–8.0)

## 2020-02-18 MED ORDER — ORTHO-NOVUM 1/35 (28) 1-35 MG-MCG PO TABS: 1.0000 | ORAL_TABLET | Freq: Every day | ORAL | 11 refills | Status: DC

## 2020-02-18 NOTE — Progress Notes (Signed)
Patient ID: [name], female   DOB: [birth year], 34 y.o.   MRN: [id number]  Chief Complaint  Patient presents with  . Abdominal Pain    HPI [name] is a 34 y.o. female.  Complains of pelvic pain and vaginal discharge for past 2 weeks.  Denies dysuria. HPI  Past Medical History:  Diagnosis Date  . No pertinent past medical history   . S/P cesarean section 08/26/2011    Past Surgical History:  Procedure Laterality Date  . CESAREAN SECTION     Breech, 2nd FTD  . CESAREAN SECTION  08/23/2011   Procedure: CESAREAN SECTION;  Surgeon: Bing Plume, MD;  Location: [facility];  Service: Gynecology;  Laterality: N/A;  Repeat    Family History  Problem Relation Age of Onset  . Healthy Mother   . Healthy Father     Social History Social History   Tobacco Use  . Smoking status: Never Smoker  . Smokeless tobacco: Never Used  Vaping Use  . Vaping Use: Never used  Substance Use Topics  . Alcohol use: No  . Drug use: Yes    Types: Marijuana    Allergies  Allergen Reactions  . Peanut-Containing Drug Products Itching and Rash    All nuts    Current Outpatient Medications  Medication Sig Dispense Refill  . metroNIDAZOLE (FLAGYL) 500 MG tablet Take 1 tablet (500 mg total) by mouth 2 (two) times daily. (Patient not taking: Reported on 02/18/2020) 14 tablet 0  . norethindrone-ethinyl estradiol 1/35 (ORTHO-NOVUM 1/35, 28,) tablet Take 1 tablet by mouth daily. 28 tablet 11   No current facility-administered medications for this visit.    Review of Systems Review of Systems Constitutional: negative for fatigue and weight loss Respiratory: negative for cough and wheezing Cardiovascular: negative for chest pain, fatigue and palpitations Gastrointestinal: negative for abdominal pain and change in bowel habits Genitourinary:positive for pelvic pain and vaginal discharge Integument/breast: negative for nipple discharge Musculoskeletal:negative for myalgias Neurological:  negative for gait problems and tremors Behavioral/Psych: negative for abusive relationship, depression Endocrine: negative for temperature intolerance      Blood pressure 124/75, pulse 82, height 5\' 9"  (1.753 m), weight (!) 309 lb (140.2 kg), last menstrual period 01/25/2020.  Physical Exam Physical Exam           General: Alert and no distress Abdomen:  normal findings: no organomegaly, soft, non-tender and no hernia  Pelvis:  External genitalia: normal general appearance Urinary system: urethral meatus normal and bladder without fullness, nontender Vaginal: normal without tenderness, induration or masses Cervix: normal appearance Adnexa: mild left adnexal tenderness, no masses Uterus: anteverted,tender, normal size    50% of 15 min visit spent on counseling and coordination of care.   Data Reviewed Wet Prep and cultures  Assessment     1. Pelvic pain Rx: - POCT Urinalysis Dipstick - 03/26/2020 PELVIC COMPLETE WITH TRANSVAGINAL; Future  2. Vaginal discharge Rx: - Cervicovaginal ancillary only( [place])  3. Encounter for other general counseling and advice on contraception - wants OCP's  4. Encounter for initial prescription of contraceptive pills Rx: - norethindrone-ethinyl estradiol 1/35 (ORTHO-NOVUM 1/35, 28,) tablet; Take 1 tablet by mouth daily.  Dispense: 28 tablet; Refill: 11  5. Class 3 severe obesity due to excess calories without serious comorbidity with body mass index (BMI) of 45.0 to 49.9 in adult [facility]) - program of caloric reduction, exercise and behavioral modification recommended    Plan  Follow up in 2 weeks   Orders  Placed This Encounter  Procedures  . US PELVIC COMPLETE WITH TRANSVAGINAL    Standing Status:   Future    Standing Expiration Date:   02/17/2021    Order Specific Question:   Reason for Exam (SYMPTOM  OR DIAGNOSIS REQUIRED)    Answer:   Pelvic pain    Order Specific Question:   Preferred imaging location?    Answer:   [facility]   . POCT Urinalysis Dipstick   Meds ordered this encounter  Medications  . norethindrone-ethinyl estradiol 1/35 (ORTHO-NOVUM 1/35, 28,) tablet    Sig: Take 1 tablet by mouth daily.    Dispense:  28 tablet    Refill:  11    Brock Bad, MD 02/18/2020 2:40 PM

## 2020-02-18 NOTE — Progress Notes (Signed)
34 y.o. GYN presents for abdominal pain 7-8/10 x 2 weeks, clear discharge, itching, dizziness, NV, dysuria, urinary frequency.  Denies fever, odor, chills.

## 2020-02-22 LAB — CERVICOVAGINAL ANCILLARY ONLY
Bacterial Vaginitis (gardnerella): NEGATIVE
Candida Glabrata: NEGATIVE
Candida Vaginitis: NEGATIVE
Chlamydia: NEGATIVE
Comment: NEGATIVE
Comment: NEGATIVE
Comment: NEGATIVE
Comment: NEGATIVE
Comment: NEGATIVE
Comment: NORMAL
Neisseria Gonorrhea: NEGATIVE
Trichomonas: NEGATIVE

## 2020-03-03 ENCOUNTER — Ambulatory Visit: Payer: Medicaid Other

## 2020-03-07 ENCOUNTER — Ambulatory Visit
Admission: RE | Admit: 2020-03-07 | Discharge: 2020-03-07 | Disposition: A | Discharge status: Home / Self Care | Payer: Medicaid Other | Source: Ambulatory Visit | Attending: Obstetrics | Admitting: Obstetrics

## 2020-03-07 ENCOUNTER — Other Ambulatory Visit: Payer: Self-pay

## 2020-03-07 DIAGNOSIS — R102 Pelvic and perineal pain: Secondary | ICD-10-CM | POA: Insufficient documentation

## 2020-03-16 DIAGNOSIS — Z419 Encounter for procedure for purposes other than remedying health state, unspecified: Secondary | ICD-10-CM | POA: Diagnosis not present

## 2020-03-21 ENCOUNTER — Other Ambulatory Visit: Payer: Self-pay

## 2020-03-21 ENCOUNTER — Ambulatory Visit (INDEPENDENT_AMBULATORY_CARE_PROVIDER_SITE_OTHER): Payer: Medicaid Other | Admitting: Obstetrics

## 2020-03-21 ENCOUNTER — Encounter: Payer: Self-pay | Admitting: Obstetrics

## 2020-03-21 VITALS — BP 127/79 | HR 80

## 2020-03-21 DIAGNOSIS — Z6841 Body Mass Index (BMI) 40.0 and over, adult: Secondary | ICD-10-CM | POA: Diagnosis not present

## 2020-03-21 DIAGNOSIS — R102 Pelvic and perineal pain: Secondary | ICD-10-CM | POA: Diagnosis not present

## 2020-03-21 DIAGNOSIS — L0292 Furuncle, unspecified: Secondary | ICD-10-CM

## 2020-03-21 DIAGNOSIS — N946 Dysmenorrhea, unspecified: Secondary | ICD-10-CM | POA: Diagnosis not present

## 2020-03-21 DIAGNOSIS — K5901 Slow transit constipation: Secondary | ICD-10-CM

## 2020-03-21 MED ORDER — POLYETHYLENE GLYCOL 3350 17 G PO PACK: 17.0000 g | PACK | Freq: Every day | ORAL | 5 refills | Status: DC

## 2020-03-21 MED ORDER — IBUPROFEN 800 MG PO TABS: 800.0000 mg | ORAL_TABLET | Freq: Three times a day (TID) | ORAL | 5 refills | Status: DC | PRN

## 2020-03-21 MED ORDER — CEPHALEXIN 500 MG PO CAPS: 500.0000 mg | ORAL_CAPSULE | Freq: Three times a day (TID) | ORAL | 0 refills | Status: AC

## 2020-03-21 NOTE — Progress Notes (Signed)
Patient ID: [name], female   DOB: Nov 09, 1985, 34 y.o.   MRN: [id number]  Chief Complaint  Patient presents with  . Follow-up    HPI [name] is a 34 y.o. female.  Complains of pelvic pain.  Also has a boil on chest that was treated at Urgent Care.  Has a history of occasional constipation. HPI  Past Medical History:  Diagnosis Date  . No pertinent past medical history   . S/P cesarean section 08/26/2011    Past Surgical History:  Procedure Laterality Date  . CESAREAN SECTION     Breech, 2nd FTD  . CESAREAN SECTION  08/23/2011   Procedure: CESAREAN SECTION;  Surgeon: Bing Plume, MD;  Location: [facility];  Service: Gynecology;  Laterality: N/A;  Repeat    Family History  Problem Relation Age of Onset  . Healthy Mother   . Healthy Father     Social History Social History   Tobacco Use  . Smoking status: Never Smoker  . Smokeless tobacco: Never Used  Vaping Use  . Vaping Use: Never used  Substance Use Topics  . Alcohol use: No  . Drug use: Yes    Types: Marijuana    Allergies  Allergen Reactions  . Peanut-Containing Drug Products Itching and Rash    All nuts    Current Outpatient Medications  Medication Sig Dispense Refill  . cephALEXin (KEFLEX) 500 MG capsule Take 1 capsule (500 mg total) by mouth 3 (three) times daily for 5 days. 15 capsule 0  . ibuprofen (ADVIL) 800 MG tablet Take 1 tablet (800 mg total) by mouth every 8 (eight) hours as needed. 30 tablet 5  . metroNIDAZOLE (FLAGYL) 500 MG tablet Take 1 tablet (500 mg total) by mouth 2 (two) times daily. (Patient not taking: Reported on 02/18/2020) 14 tablet 0  . norethindrone-ethinyl estradiol 1/35 (ORTHO-NOVUM 1/35, 28,) tablet Take 1 tablet by mouth daily. 28 tablet 11  . polyethylene glycol (MIRALAX) 17 g packet Take 17 g by mouth daily. 14 each 5   No current facility-administered medications for this visit.    Review of Systems Review of Systems Constitutional: negative for fatigue and  weight loss Respiratory: negative for cough and wheezing Cardiovascular: negative for chest pain, fatigue and palpitations Gastrointestinal: positive for occasional constipation Genitourinary: positive for pelvic pain Integument/breast: positive for boil on chest Musculoskeletal:negative for myalgias Neurological: negative for gait problems and tremors Behavioral/Psych: negative for abusive relationship, depression Endocrine: negative for temperature intolerance      Blood pressure 127/79, pulse 80.  Physical Exam Physical Exam:  Deferred           >50% of 15 min visit spent on counseling and coordination of care.   Data Reviewed Ultrasound:  US PELVIC COMPLETE WITH TRANSVAGINAL (Accession [id number]) (Order [id number]) Imaging Date: 03/07/2020 Department: [facility] Released By: Elesa Hacker Authorizing: Brock Bad, MD  Exam Status  Status  Final [99]  PACS Intelerad Image Link  Show images for US PELVIC COMPLETE WITH TRANSVAGINAL Study Result  Narrative & Impression  CLINICAL DATA:  Pelvic pain of unspecified duration, LMP 01/25/2020. Past history of Caesarean section; G4P4  EXAM: TRANSABDOMINAL AND TRANSVAGINAL ULTRASOUND OF PELVIS  TECHNIQUE: Both transabdominal and transvaginal ultrasound examinations of the pelvis were performed. Transabdominal technique was performed for global imaging of the pelvis including uterus, ovaries, adnexal regions, and pelvic cul-de-sac. It was necessary to proceed with endovaginal exam following the transabdominal exam to visualize the uterine  fundus and endometrium.  COMPARISON:  None  FINDINGS: Uterus  Measurements: 10.8 x 5.3 x 6.3 cm = volume: 189 mL. Retroflexed. Otherwise normal morphology without mass.  Endometrium  Thickness: 13 mm.  No endometrial fluid or focal abnormality  Right ovary  Measurements: 4.6 x 1.5 x 2.1 cm = volume: 7.6 mL. Normal  morphology without mass  Left ovary  Measurements: 5.5 x 3.0 x 2.9 cm = volume: 24.7 mL. Normal morphology without mass  Other findings  Trace free pelvic fluid.  No adnexal masses.  IMPRESSION: No pelvic sonographic abnormalities identified.   Electronically Signed   By: Ulyses Southward M.D.   On: 03/07/2020 16:34      Assessment       1. Pelvic pain - etiology unknown.  Ultrasound is WNL's.  Possible IBS.- C. - will continue to follow clinically   2. Dysmenorrhea Rx: - ibuprofen (ADVIL) 800 MG tablet; Take 1 tablet (800 mg total) by mouth every 8 (eight) hours as needed.  Dispense: 30 tablet; Refill: 5  3. Boil Rx: - cephALEXin (KEFLEX) 500 MG capsule; Take 1 capsule (500 mg total) by mouth 3 (three) times daily for 5 days.  Dispense: 15 capsule; Refill: 0  4. Slow transit constipation Rx: - polyethylene glycol (MIRALAX) 17 g packet; Take 17 g by mouth daily.  Dispense: 14 each; Refill: 5  5. Class 3 severe obesity due to excess calories without serious comorbidity with body mass index (BMI) of 45.0 to 49.9 in adult [facility]) - program of caloric reduction, exercise and behavioral modification recommended  Plan   Follow up in 3 months.  Meds ordered this encounter  Medications  . ibuprofen (ADVIL) 800 MG tablet    Sig: Take 1 tablet (800 mg total) by mouth every 8 (eight) hours as needed.    Dispense:  30 tablet    Refill:  5  . cephALEXin (KEFLEX) 500 MG capsule    Sig: Take 1 capsule (500 mg total) by mouth 3 (three) times daily for 5 days.    Dispense:  15 capsule    Refill:  0  . polyethylene glycol (MIRALAX) 17 g packet    Sig: Take 17 g by mouth daily.    Dispense:  14 each    Refill:  5     Brock Bad, MD 03/21/2020 10:06 AM

## 2020-04-16 DIAGNOSIS — Z419 Encounter for procedure for purposes other than remedying health state, unspecified: Secondary | ICD-10-CM | POA: Diagnosis not present

## 2020-05-17 DIAGNOSIS — Z419 Encounter for procedure for purposes other than remedying health state, unspecified: Secondary | ICD-10-CM | POA: Diagnosis not present

## 2020-06-14 DIAGNOSIS — Z419 Encounter for procedure for purposes other than remedying health state, unspecified: Secondary | ICD-10-CM | POA: Diagnosis not present

## 2020-07-13 ENCOUNTER — Other Ambulatory Visit (HOSPITAL_COMMUNITY)
Admission: RE | Admit: 2020-07-13 | Discharge: 2020-07-13 | Disposition: A | Discharge status: Home / Self Care | Payer: Medicaid Other | Source: Ambulatory Visit | Attending: Obstetrics & Gynecology | Admitting: Obstetrics & Gynecology

## 2020-07-13 ENCOUNTER — Ambulatory Visit (INDEPENDENT_AMBULATORY_CARE_PROVIDER_SITE_OTHER): Payer: Medicaid Other

## 2020-07-13 ENCOUNTER — Other Ambulatory Visit: Payer: Self-pay

## 2020-07-13 DIAGNOSIS — Z113 Encounter for screening for infections with a predominantly sexual mode of transmission: Secondary | ICD-10-CM

## 2020-07-13 DIAGNOSIS — N898 Other specified noninflammatory disorders of vagina: Secondary | ICD-10-CM | POA: Insufficient documentation

## 2020-07-13 NOTE — Progress Notes (Addendum)
SUBJECTIVE:  35 y.o. female complains of malodorous vaginal discharge for 3 day(s). No abnormal bleeding. No UTI symptoms. Denies history of known exposure to STD.  LMP 07/12/20  OBJECTIVE:  She appears well, afebrile. Urine dipstick: not done.  ASSESSMENT:  Vaginal Discharge  Vaginal Odor   PLAN:  GC, chlamydia, trichomonas, BVAG, CVAG probe sent to lab. Pt declines STD blood work at this time.  Treatment: To be determined once the provider reviews the results.  ROV prn if symptoms persist or worsen.

## 2020-07-14 ENCOUNTER — Other Ambulatory Visit: Payer: Self-pay

## 2020-07-14 LAB — CERVICOVAGINAL ANCILLARY ONLY
Bacterial Vaginitis (gardnerella): POSITIVE — AB
Candida Glabrata: NEGATIVE
Candida Vaginitis: NEGATIVE
Chlamydia: NEGATIVE
Comment: NEGATIVE
Comment: NEGATIVE
Comment: NEGATIVE
Comment: NEGATIVE
Comment: NEGATIVE
Comment: NORMAL
Neisseria Gonorrhea: NEGATIVE
Trichomonas: NEGATIVE

## 2020-07-14 MED ORDER — METRONIDAZOLE 500 MG PO TABS: 500.0000 mg | ORAL_TABLET | Freq: Two times a day (BID) | ORAL | 0 refills | Status: DC

## 2020-07-14 NOTE — Telephone Encounter (Signed)
Patient tested positive for BV and would like to be treated for this. Will send in Flagyl.

## 2020-07-15 DIAGNOSIS — Z419 Encounter for procedure for purposes other than remedying health state, unspecified: Secondary | ICD-10-CM | POA: Diagnosis not present

## 2020-07-21 ENCOUNTER — Ambulatory Visit (INDEPENDENT_AMBULATORY_CARE_PROVIDER_SITE_OTHER): Payer: Medicaid Other | Admitting: Internal Medicine

## 2020-07-21 ENCOUNTER — Encounter: Payer: Self-pay | Admitting: Internal Medicine

## 2020-07-21 ENCOUNTER — Other Ambulatory Visit: Payer: Self-pay

## 2020-07-21 VITALS — BP 120/82 | HR 96 | Wt 342.9 lb

## 2020-07-21 DIAGNOSIS — N939 Abnormal uterine and vaginal bleeding, unspecified: Secondary | ICD-10-CM

## 2020-07-21 DIAGNOSIS — R030 Elevated blood-pressure reading, without diagnosis of hypertension: Secondary | ICD-10-CM

## 2020-07-21 NOTE — Assessment & Plan Note (Signed)
Patient reports last Tuesday she was having worse than usual abdominal cramps and passed large blood clots. She states the bleeding slowed down after a few hours and completely stopped on Wednesday. She states she has had a miscarriage once before in 2019 and this felt similar. She also states on Thursday she was resting and started having severe bilateral lower back pain, sharp in nature. She denies any numbness, tingling, difficulty urinating or with bowel movements. Denies fevers, vomiting, increased urinary frequency or burning. She does endorse nausea and continued cramping lower abdominal pain. She denies any more bleeding since last Wednesday. States typically her cycle lasts up to 5 days but this only last 3 days. She also reports she was recently seen by her obgyn regarding abnormal vaginal discharge and found to have BV and treated with flagyl which she has completed. She states she stopped taking her birth control a while back because it was causing hair loss.  Discussed with patient this sounds consistent with a miscarriage but I would like her to follow up with her obgyn given her continued abdominal and back pain. Her back exam was unremarkable, no flank pain or limited range of motion. She was slightly tender on abdominal and lower back.   Plan:  Referral to OBGYN Follow up serum b-hcg and cbc

## 2020-07-21 NOTE — Progress Notes (Signed)
   CC: passing blood clots, back pain  HPI:  [name] is a 35 y.o. with a past medical history of 3 cesarean sections and one miscarriage in 2019 presenting for evaluation of abnormal bleeding and back pain. For details of today's visit and the status of his chronic medical issues please refer to the assessment and plan.   Past Medical History:  Diagnosis Date  . No pertinent past medical history   . S/P cesarean section 08/26/2011   Review of Systems:   Review of Systems  Constitutional: Negative for chills, fever and malaise/fatigue.  Respiratory: Negative for shortness of breath.   Cardiovascular: Negative for chest pain and leg swelling.  Gastrointestinal: Positive for abdominal pain and nausea. Negative for constipation, diarrhea and vomiting.  Genitourinary: Negative for flank pain and hematuria.  Musculoskeletal: Positive for back pain.  Neurological: Negative for dizziness, weakness and headaches.     Physical Exam:  Vitals:   07/21/20 1523 07/21/20 1524  BP: (!) 156/92 120/82  Pulse: 96 96  SpO2: 99%   Weight: (!) 342 lb 14.4 oz (155.5 kg)     Physical Exam Vitals reviewed.  Constitutional:      General: She is not in acute distress.    Appearance: She is well-developed. She is not ill-appearing.  Cardiovascular:     Rate and Rhythm: Normal rate and regular rhythm.     Heart sounds: Normal heart sounds. No murmur heard. No friction rub. No gallop.   Pulmonary:     Effort: Pulmonary effort is normal. No respiratory distress.     Breath sounds: Normal breath sounds. No wheezing or rales.  Abdominal:     General: Abdomen is flat. Bowel sounds are normal. There is no distension.     Palpations: Abdomen is soft.     Tenderness: There is abdominal tenderness. There is no right CVA tenderness or left CVA tenderness.  Genitourinary:    Vagina: Vaginal discharge present. No bleeding.  Musculoskeletal:     Right lower leg: No edema.     Left lower leg:  No edema.  Skin:    General: Skin is warm and dry.  Neurological:     Mental Status: She is alert and oriented to person, place, and time.  Psychiatric:        Mood and Affect: Mood normal.        Behavior: Behavior normal.        Thought Content: Thought content normal.        Judgment: Judgment normal.     Assessment & Plan:   See Encounters Tab for problem based charting.  Patient discussed with Dr. Heide Spark

## 2020-07-21 NOTE — Patient Instructions (Signed)
It was a pleasure meeting you today!   I have ordered some blood work to check your blood counts and pregnancy status. I am also working on setting you up to see your OBGYN tomorrow.   Should you have any questions or concerns please call the internal medicine clinic at [phone].

## 2020-07-22 ENCOUNTER — Encounter: Payer: Self-pay | Admitting: Obstetrics and Gynecology

## 2020-07-22 ENCOUNTER — Ambulatory Visit (INDEPENDENT_AMBULATORY_CARE_PROVIDER_SITE_OTHER): Payer: Medicaid Other | Admitting: Obstetrics and Gynecology

## 2020-07-22 VITALS — BP 125/80 | HR 77 | Ht 69.0 in | Wt 338.0 lb

## 2020-07-22 DIAGNOSIS — N939 Abnormal uterine and vaginal bleeding, unspecified: Secondary | ICD-10-CM

## 2020-07-22 LAB — CBC WITH DIFFERENTIAL/PLATELET
Basophils Absolute: 0.1 10*3/uL (ref 0.0–0.2)
Basos: 1 %
EOS (ABSOLUTE): 0.1 10*3/uL (ref 0.0–0.4)
Eos: 2 %
Hematocrit: 35.6 % (ref 34.0–46.6)
Hemoglobin: 11.9 g/dL (ref 11.1–15.9)
Immature Grans (Abs): 0 10*3/uL (ref 0.0–0.1)
Immature Granulocytes: 0 %
Lymphocytes Absolute: 2.1 10*3/uL (ref 0.7–3.1)
Lymphs: 33 %
MCH: 29.4 pg (ref 26.6–33.0)
MCHC: 33.4 g/dL (ref 31.5–35.7)
MCV: 88 fL (ref 79–97)
Monocytes Absolute: 0.6 10*3/uL (ref 0.1–0.9)
Monocytes: 9 %
Neutrophils Absolute: 3.5 10*3/uL (ref 1.4–7.0)
Neutrophils: 55 %
Platelets: 206 10*3/uL (ref 150–450)
RBC: 4.05 x10E6/uL (ref 3.77–5.28)
RDW: 13.7 % (ref 11.7–15.4)
WBC: 6.3 10*3/uL (ref 3.4–10.8)

## 2020-07-22 LAB — HCG, SERUM, QUALITATIVE: hCG,Beta Subunit,Qual,Serum: NEGATIVE m[IU]/mL (ref <6)

## 2020-07-22 NOTE — Progress Notes (Signed)
Patient ID: [name], female   DOB: [birth year], 35 y.o.   MRN: [id number] Ms Candy presents with C/O late cycle last month. Cycle was about 1 week late. The cycle was heavier and more cramps. Bleeding and cramps have resolved. She had a + home UPT first part of March. Negative UPT yesterday. Prior use of OCP's, stopped in Dec/Jan due to hair loss. Sexual active. No Contraception. Denies any bowel or bladder dysfunction.  PE AF VSS Lungs clear Heart RRR Abd soft + BS  A/P AUB, suspect miscarriage Pt reassured. Follow cycle. Contraception information provided to pt. F/U PRN

## 2020-07-22 NOTE — Patient Instructions (Signed)
Health Maintenance, Female Adopting a healthy lifestyle and getting preventive care are important in promoting health and wellness. Ask your health care provider about:  The right schedule for you to have regular tests and exams.  Things you can do on your own to prevent diseases and keep yourself healthy. What should I know about diet, weight, and exercise? Eat a healthy diet  Eat a diet that includes plenty of vegetables, fruits, low-fat dairy products, and lean protein.  Do not eat a lot of foods that are high in solid fats, added sugars, or sodium.   Maintain a healthy weight Body mass index (BMI) is used to identify weight problems. It estimates body fat based on height and weight. Your health care provider can help determine your BMI and help you achieve or maintain a healthy weight. Get regular exercise Get regular exercise. This is one of the most important things you can do for your health. Most adults should:  Exercise for at least 150 minutes each week. The exercise should increase your heart rate and make you sweat (moderate-intensity exercise).  Do strengthening exercises at least twice a week. This is in addition to the moderate-intensity exercise.  Spend less time sitting. Even light physical activity can be beneficial. Watch cholesterol and blood lipids Have your blood tested for lipids and cholesterol at 37 years of age, then have this test every 5 years. Have your cholesterol levels checked more often if:  Your lipid or cholesterol levels are high.  You are older than 37 years of age.  You are at high risk for heart disease. What should I know about cancer screening? Depending on your health history and family history, you may need to have cancer screening at various ages. This may include screening for:  Breast cancer.  Cervical cancer.  Colorectal cancer.  Skin cancer.  Lung cancer. What should I know about heart disease, diabetes, and high blood  pressure? Blood pressure and heart disease  High blood pressure causes heart disease and increases the risk of stroke. This is more likely to develop in people who have high blood pressure readings, are of African descent, or are overweight.  Have your blood pressure checked: ? Every 3-5 years if you are 18-39 years of age. ? Every year if you are 40 years old or older. Diabetes Have regular diabetes screenings. This checks your fasting blood sugar level. Have the screening done:  Once every three years after age 40 if you are at a normal weight and have a low risk for diabetes.  More often and at a younger age if you are overweight or have a high risk for diabetes. What should I know about preventing infection? Hepatitis B If you have a higher risk for hepatitis B, you should be screened for this virus. Talk with your health care provider to find out if you are at risk for hepatitis B infection. Hepatitis C Testing is recommended for:  Everyone born from 1945 through 1965.  Anyone with known risk factors for hepatitis C. Sexually transmitted infections (STIs)  Get screened for STIs, including gonorrhea and chlamydia, if: ? You are sexually active and are younger than 37 years of age. ? You are older than 37 years of age and your health care provider tells you that you are at risk for this type of infection. ? Your sexual activity has changed since you were last screened, and you are at increased risk for chlamydia or gonorrhea. Ask your health care provider   if you are at risk.  Ask your health care provider about whether you are at high risk for HIV. Your health care provider may recommend a prescription medicine to help prevent HIV infection. If you choose to take medicine to prevent HIV, you should first get tested for HIV. You should then be tested every 3 months for as long as you are taking the medicine. Pregnancy  If you are about to stop having your period (premenopausal) and  you may become pregnant, seek counseling before you get pregnant.  Take 400 to 800 micrograms (mcg) of folic acid every day if you become pregnant.  Ask for birth control (contraception) if you want to prevent pregnancy. Osteoporosis and menopause Osteoporosis is a disease in which the bones lose minerals and strength with aging. This can result in bone fractures. If you are 65 years old or older, or if you are at risk for osteoporosis and fractures, ask your health care provider if you should:  Be screened for bone loss.  Take a calcium or vitamin D supplement to lower your risk of fractures.  Be given hormone replacement therapy (HRT) to treat symptoms of menopause. Follow these instructions at home: Lifestyle  Do not use any products that contain nicotine or tobacco, such as cigarettes, e-cigarettes, and chewing tobacco. If you need help quitting, ask your health care provider.  Do not use street drugs.  Do not share needles.  Ask your health care provider for help if you need support or information about quitting drugs. Alcohol use  Do not drink alcohol if: ? Your health care provider tells you not to drink. ? You are pregnant, may be pregnant, or are planning to become pregnant.  If you drink alcohol: ? Limit how much you use to 0-1 drink a day. ? Limit intake if you are breastfeeding.  Be aware of how much alcohol is in your drink. In the [place] one drink equals one 12 oz bottle of beer (355 mL), one 5 oz glass of wine (148 mL), or one 1 oz glass of hard liquor (44 mL). General instructions  Schedule regular health, dental, and eye exams.  Stay current with your vaccines.  Tell your health care provider if: ? You often feel depressed. ? You have ever been abused or do not feel safe at home. Summary  Adopting a healthy lifestyle and getting preventive care are important in promoting health and wellness.  Follow your health care provider's instructions about healthy  diet, exercising, and getting tested or screened for diseases.  Follow your health care provider's instructions on monitoring your cholesterol and blood pressure. This information is not intended to replace advice given to you by your health care provider. Make sure you discuss any questions you have with your health care provider. Document Revised: 03/26/2018 Document Reviewed: 03/26/2018 Elsevier Patient Education  2021 [employer] Inc.  

## 2020-07-22 NOTE — Progress Notes (Signed)
Patient presents for AUB. She states that she had one heavy cycle last week. Cycles prior to last week had been normal and coming every month. She states that she passed one large heavy clot with increased cramping. She states that she had a positive HPT in February, but never followed up in a office for a confirmation. She was recently on ocp, which she discontinued in December.  Last pap: 05/2019 Normal

## 2020-07-25 NOTE — Progress Notes (Signed)
Internal Medicine Clinic Attending ° °Case discussed with Dr. Rehman  At the time of the visit.  We reviewed the resident’s history and exam and pertinent patient test results.  I agree with the assessment, diagnosis, and plan of care documented in the resident’s note.  ° °

## 2020-08-01 ENCOUNTER — Ambulatory Visit: Payer: Medicaid Other | Admitting: Obstetrics and Gynecology

## 2020-08-14 DIAGNOSIS — Z419 Encounter for procedure for purposes other than remedying health state, unspecified: Secondary | ICD-10-CM | POA: Diagnosis not present

## 2020-08-19 ENCOUNTER — Other Ambulatory Visit: Payer: Self-pay

## 2020-08-19 ENCOUNTER — Ambulatory Visit
Admission: EM | Admit: 2020-08-19 | Discharge: 2020-08-19 | Disposition: A | Discharge status: Home / Self Care | Payer: Medicaid Other | Attending: Internal Medicine | Admitting: Internal Medicine

## 2020-08-19 ENCOUNTER — Encounter: Payer: Self-pay | Admitting: Emergency Medicine

## 2020-08-19 DIAGNOSIS — N760 Acute vaginitis: Secondary | ICD-10-CM | POA: Diagnosis not present

## 2020-08-19 DIAGNOSIS — B9689 Other specified bacterial agents as the cause of diseases classified elsewhere: Secondary | ICD-10-CM | POA: Diagnosis not present

## 2020-08-19 LAB — POCT URINALYSIS DIP (MANUAL ENTRY)
Bilirubin, UA: NEGATIVE
Blood, UA: NEGATIVE
Glucose, UA: NEGATIVE mg/dL
Ketones, POC UA: NEGATIVE mg/dL
Leukocytes, UA: NEGATIVE
Nitrite, UA: NEGATIVE
Protein Ur, POC: NEGATIVE mg/dL
Spec Grav, UA: 1.015 (ref 1.010–1.025)
Urobilinogen, UA: 0.2 E.U./dL
pH, UA: 6.5 (ref 5.0–8.0)

## 2020-08-19 LAB — POCT URINE PREGNANCY: Preg Test, Ur: NEGATIVE

## 2020-08-19 MED ORDER — BORIC ACID VAGINAL 600 MG VA SUPP
1.0000 | Freq: Every evening | VAGINAL | 0 refills | Status: DC
Start: 2020-08-19 — End: 2020-08-22

## 2020-08-19 MED ORDER — METRONIDAZOLE 500 MG PO TABS: 500.0000 mg | ORAL_TABLET | Freq: Two times a day (BID) | ORAL | 0 refills | Status: DC

## 2020-08-19 NOTE — ED Provider Notes (Signed)
[facility]    CSN: [id number] Arrival date & time: 08/19/20  1538      History   Chief Complaint Chief Complaint  Patient presents with  . Exposure to STD  . Vaginal Itching    HPI [name] is a 35 y.o. female comes to the urgent care with 2-day history of vaginal irritation and some vaginal discharge.  Patient describes vaginal discharge as thin with fishy smell.  She denies any dysuria, urgency or frequency.  No flank pain.  No nausea or vomiting.  She has some lower abdominal pain.  No deep dyspareunia.  No fever or chills.  Patient gets bacterial vaginosis on a recurrent basis especially after her menstrual periods are completed.   HPI  Past Medical History:  Diagnosis Date  . No pertinent past medical history   . S/P cesarean section 08/26/2011    Patient Active Problem List   Diagnosis Date Noted  . Abnormal uterine and vaginal bleeding, unspecified 07/21/2020  . S/P cesarean section 08/26/2011    Past Surgical History:  Procedure Laterality Date  . CESAREAN SECTION     Breech, 2nd FTD  . CESAREAN SECTION  08/23/2011   Procedure: CESAREAN SECTION;  Surgeon: Bing Plume, MD;  Location: [facility];  Service: Gynecology;  Laterality: N/A;  Repeat    OB History    Gravida  4   Para  4   Term  4   Preterm      AB      Living  4     SAB      IAB      Ectopic      Multiple      Live Births  4            Home Medications    Prior to Admission medications   Medication Sig Start Date End Date Taking? Authorizing Provider  Boric Acid Vaginal 600 MG SUPP Place 1 suppository vaginally at bedtime. 08/19/20  Yes Millard Bautch, Britta Mccreedy, MD  metroNIDAZOLE (FLAGYL) 500 MG tablet Take 1 tablet (500 mg total) by mouth 2 (two) times daily. 08/19/20  Yes Teejay Meader, Britta Mccreedy, MD  ibuprofen (ADVIL) 800 MG tablet Take 1 tablet (800 mg total) by mouth every 8 (eight) hours as needed. 03/21/20   Brock Bad, MD  norgestimate-ethinyl estradiol  (ORTHO-CYCLEN) 0.25-35 MG-MCG tablet Take 1 tablet by mouth daily. Patient not taking: Reported on 12/11/2019 05/18/19 12/24/19  Sharyon Cable, CNM    Family History Family History  Problem Relation Age of Onset  . Healthy Mother   . Healthy Father     Social History Social History   Tobacco Use  . Smoking status: Never Smoker  . Smokeless tobacco: Never Used  Vaping Use  . Vaping Use: Never used  Substance Use Topics  . Alcohol use: No  . Drug use: Not Currently    Types: Marijuana    Comment: Last used over a year ago     Allergies   Peanut-containing drug products   Review of Systems Review of Systems  Constitutional: Negative.   Gastrointestinal: Positive for abdominal pain.  Genitourinary: Positive for vaginal discharge. Negative for dysuria, frequency, urgency and vaginal bleeding.  Neurological: Negative.      Physical Exam Triage Vital Signs ED Triage Vitals  Enc Vitals Group     BP 08/19/20 1653 112/73     Pulse Rate 08/19/20 1653 85     Resp 08/19/20 1653 20  Temp 08/19/20 1653 98 F (36.7 C)     Temp Source 08/19/20 1653 Oral     SpO2 08/19/20 1653 98 %     Weight --      Height --      Head Circumference --      Peak Flow --      Pain Score 08/19/20 1651 2     Pain Loc --      Pain Edu? --      Excl. in GC? --    No data found.  Updated Vital Signs BP 112/73 (BP Location: Left Arm)   Pulse 85   Temp 98 F (36.7 C) (Oral)   Resp 20   SpO2 98%   Visual Acuity Right Eye Distance:   Left Eye Distance:   Bilateral Distance:    Right Eye Near:   Left Eye Near:    Bilateral Near:     Physical Exam Vitals and nursing note reviewed.  Constitutional:      General: She is not in acute distress.    Appearance: She is not ill-appearing.  Pulmonary:     Effort: Pulmonary effort is normal.     Breath sounds: Normal breath sounds.  Abdominal:     General: Bowel sounds are normal.     Palpations: Abdomen is soft.   Musculoskeletal:        General: Normal range of motion.  Neurological:     Mental Status: She is alert.      UC Treatments / Results  Labs (all labs ordered are listed, but only abnormal results are displayed) Labs Reviewed  POCT URINALYSIS DIP (MANUAL ENTRY)  POCT URINE PREGNANCY  CERVICOVAGINAL ANCILLARY ONLY    EKG   Radiology No results found.  Procedures Procedures (including critical care time)  Medications Ordered in UC Medications - No data to display  Initial Impression / Assessment and Plan / UC Course  I have reviewed the triage vital signs and the nursing notes.  Pertinent labs & imaging results that were available during my care of the patient were reviewed by me and considered in my medical decision making (see chart for details).    1.  Bacterial vaginosis: Point-of-care urinalysis is negative for urinary tract infection Metronidazole 500 mg twice daily x7 days Cervicovaginal swab for vaginal yeast infection/bacterial vaginosis We will call you with recommendations if labs are abnormal Return to urgent care if symptoms worsen Boric acid 600 mg vaginal suppositories nightly for 14 days. Final Clinical Impressions(s) / UC Diagnoses   Final diagnoses:  Bacterial vaginitis     Discharge Instructions     Use medications as prescribed If you have worsening symptoms please return to the urgent care We will call you with recommendations if labs are abnormal   ED Prescriptions    Medication Sig Dispense Auth. Provider   metroNIDAZOLE (FLAGYL) 500 MG tablet Take 1 tablet (500 mg total) by mouth 2 (two) times daily. 14 tablet Kenichi Cassada, Britta Mccreedy, MD   Boric Acid Vaginal 600 MG SUPP Place 1 suppository vaginally at bedtime. 15 suppository Manahil Vanzile, Britta Mccreedy, MD     PDMP not reviewed this encounter.   Merrilee Jansky, MD 08/19/20 [phone]

## 2020-08-19 NOTE — ED Triage Notes (Signed)
Pt here for vaginal irritation and some burning with urination

## 2020-08-19 NOTE — Discharge Instructions (Signed)
Use medications as prescribed If you have worsening symptoms please return to the urgent care We will call you with recommendations if labs are abnormal

## 2020-08-22 ENCOUNTER — Telehealth: Payer: Self-pay

## 2020-08-22 LAB — CERVICOVAGINAL ANCILLARY ONLY
Bacterial Vaginitis (gardnerella): POSITIVE — AB
Candida Glabrata: NEGATIVE
Candida Vaginitis: NEGATIVE
Comment: NEGATIVE
Comment: NEGATIVE
Comment: NEGATIVE

## 2020-08-22 MED ORDER — BORIC ACID VAGINAL 600 MG VA SUPP: 1.0000 | Freq: Every evening | VAGINAL | 0 refills | Status: DC

## 2020-09-14 DIAGNOSIS — Z419 Encounter for procedure for purposes other than remedying health state, unspecified: Secondary | ICD-10-CM | POA: Diagnosis not present

## 2020-09-26 ENCOUNTER — Other Ambulatory Visit: Payer: Self-pay

## 2020-09-26 ENCOUNTER — Encounter: Payer: Self-pay | Admitting: *Deleted

## 2020-09-26 ENCOUNTER — Ambulatory Visit
Admission: EM | Admit: 2020-09-26 | Discharge: 2020-09-26 | Disposition: A | Discharge status: Home / Self Care | Payer: Medicaid Other | Attending: Student | Admitting: Student

## 2020-09-26 DIAGNOSIS — B9689 Other specified bacterial agents as the cause of diseases classified elsewhere: Secondary | ICD-10-CM | POA: Diagnosis not present

## 2020-09-26 DIAGNOSIS — Z3202 Encounter for pregnancy test, result negative: Secondary | ICD-10-CM | POA: Diagnosis not present

## 2020-09-26 DIAGNOSIS — N760 Acute vaginitis: Secondary | ICD-10-CM | POA: Diagnosis not present

## 2020-09-26 HISTORY — DX: Acute vaginitis: N76.0

## 2020-09-26 HISTORY — DX: Other specified bacterial agents as the cause of diseases classified elsewhere: B96.89

## 2020-09-26 LAB — POCT URINALYSIS DIP (MANUAL ENTRY)
Bilirubin, UA: NEGATIVE
Blood, UA: NEGATIVE
Glucose, UA: NEGATIVE mg/dL
Ketones, POC UA: NEGATIVE mg/dL
Nitrite, UA: NEGATIVE
Protein Ur, POC: NEGATIVE mg/dL
Spec Grav, UA: >=1.03 — AB (ref 1.010–1.025)
Urobilinogen, UA: 0.2 E.U./dL
pH, UA: 6 (ref 5.0–8.0)

## 2020-09-26 LAB — POCT URINE PREGNANCY: Preg Test, Ur: NEGATIVE

## 2020-09-26 MED ORDER — METRONIDAZOLE 500 MG PO TABS: 500.0000 mg | ORAL_TABLET | Freq: Two times a day (BID) | ORAL | 0 refills | Status: DC

## 2020-09-26 NOTE — ED Provider Notes (Signed)
[facility]    CSN: [id number] Arrival date & time: 09/26/20  1801      History   Chief Complaint Chief Complaint  Patient presents with   Vaginal Discharge    HPI [name] is a 35 y.o. female presenting with vaginal discharge x2 days.  Medical history recurrent BV, was last diagnosed with this at our urgent care about 3 weeks ago.  States this improved after course of Flagyl, but the symptoms have returned.  Describes 2 days of thin watery clear vaginal discharge with external vaginal irritation and itching.  Occasional lower abdominal cramping consistent with her typical BV symptoms.  Denies new partners since her last STI screen, denies fever/chills, nausea, vomiting, diarrhea, vaginal rashes or lesions, hematuria, frequency, urgency, dysuria   HPI  Past Medical History:  Diagnosis Date   Bacterial vaginal infection    S/P cesarean section 08/26/2011    Patient Active Problem List   Diagnosis Date Noted   Abnormal uterine and vaginal bleeding, unspecified 07/21/2020   S/P cesarean section 08/26/2011    Past Surgical History:  Procedure Laterality Date   CESAREAN SECTION     Breech, 2nd FTD   CESAREAN SECTION  08/23/2011   Procedure: CESAREAN SECTION;  Surgeon: Bing Plume, MD;  Location: [facility];  Service: Gynecology;  Laterality: N/A;  Repeat    OB History     Gravida  4   Para  4   Term  4   Preterm      AB      Living  4      SAB      IAB      Ectopic      Multiple      Live Births  4            Home Medications    Prior to Admission medications   Medication Sig Start Date End Date Taking? Authorizing Provider  metroNIDAZOLE (FLAGYL) 500 MG tablet Take 1 tablet (500 mg total) by mouth 2 (two) times daily. 09/26/20  Yes Rhys Martini, PA-C  Boric Acid Vaginal 600 MG SUPP Place 1 suppository vaginally at bedtime. 08/22/20   Wieters, Hallie C, PA-C  ibuprofen (ADVIL) 800 MG tablet Take 1 tablet (800 mg total) by  mouth every 8 (eight) hours as needed. 03/21/20   Brock Bad, MD  norgestimate-ethinyl estradiol (ORTHO-CYCLEN) 0.25-35 MG-MCG tablet Take 1 tablet by mouth daily. Patient not taking: Reported on 12/11/2019 05/18/19 12/24/19  Sharyon Cable, CNM    Family History Family History  Problem Relation Age of Onset   Healthy Mother    Healthy Father     Social History Social History   Tobacco Use   Smoking status: Never   Smokeless tobacco: Never  Vaping Use   Vaping Use: Never used  Substance Use Topics   Alcohol use: No   Drug use: Not Currently    Types: Marijuana    Comment: Last used over a year ago     Allergies   Peanut-containing drug products   Review of Systems Review of Systems  Constitutional:  Negative for chills and fever.  HENT:  Negative for sore throat.   Eyes:  Negative for pain and redness.  Respiratory:  Negative for shortness of breath.   Cardiovascular:  Negative for chest pain.  Gastrointestinal:  Negative for abdominal pain, diarrhea, nausea and vomiting.  Genitourinary:  Positive for vaginal discharge. Negative for decreased urine volume, difficulty urinating,  dysuria, flank pain, frequency, genital sores, hematuria, menstrual problem, pelvic pain, urgency, vaginal bleeding and vaginal pain.  Musculoskeletal:  Negative for back pain.  Skin:  Negative for rash.  All other systems reviewed and are negative.   Physical Exam Triage Vital Signs ED Triage Vitals [09/26/20 1915]  Enc Vitals Group     BP 123/80     Pulse Rate 80     Resp 14     Temp 98 F (36.7 C)     Temp Source Oral     SpO2 96 %     Weight      Height      Head Circumference      Peak Flow      Pain Score 6     Pain Loc      Pain Edu?      Excl. in GC?    No data found.  Updated Vital Signs BP 123/80   Pulse 80   Temp 98 F (36.7 C) (Oral)   Resp 14   LMP 09/04/2020 (Approximate)   SpO2 96%   Visual Acuity Right Eye Distance:   Left Eye Distance:    Bilateral Distance:    Right Eye Near:   Left Eye Near:    Bilateral Near:     Physical Exam Vitals reviewed.  Constitutional:      General: She is not in acute distress.    Appearance: Normal appearance. She is not ill-appearing.  HENT:     Head: Normocephalic and atraumatic.     Mouth/Throat:     Mouth: Mucous membranes are moist.     Comments: Moist mucous membranes Eyes:     Extraocular Movements: Extraocular movements intact.     Pupils: Pupils are equal, round, and reactive to light.  Cardiovascular:     Rate and Rhythm: Normal rate and regular rhythm.     Heart sounds: Normal heart sounds.  Pulmonary:     Effort: Pulmonary effort is normal.     Breath sounds: Normal breath sounds. No wheezing, rhonchi or rales.  Abdominal:     General: Bowel sounds are normal. There is no distension.     Palpations: Abdomen is soft. There is no mass.     Tenderness: There is no abdominal tenderness. There is no right CVA tenderness, left CVA tenderness, guarding or rebound.  Genitourinary:    Comments: deferred Skin:    General: Skin is warm.     Capillary Refill: Capillary refill takes less than 2 seconds.     Comments: Good skin turgor  Neurological:     General: No focal deficit present.     Mental Status: She is alert and oriented to person, place, and time.  Psychiatric:        Mood and Affect: Mood normal.        Behavior: Behavior normal.     UC Treatments / Results  Labs (all labs ordered are listed, but only abnormal results are displayed) Labs Reviewed  POCT URINALYSIS DIP (MANUAL ENTRY) - Abnormal; Notable for the following components:      Result Value   Clarity, UA cloudy (*)    Spec Grav, UA >=1.030 (*)    Leukocytes, UA Small (1+) (*)    All other components within normal limits  POCT URINE PREGNANCY  CERVICOVAGINAL ANCILLARY ONLY    EKG   Radiology No results found.  Procedures Procedures (including critical care time)  Medications Ordered in  UC Medications - No data  to display  Initial Impression / Assessment and Plan / UC Course  I have reviewed the triage vital signs and the nursing notes.  Pertinent labs & imaging results that were available during my care of the patient were reviewed by me and considered in my medical decision making (see chart for details).     This patient is a 34 year old female presenting with suspected BV. She last tested positive for BV on 5/6. Afebrile, nontachycardic, no abdominal pain or CVAT. Negative urine pregnancy today. UA with small leuk, otherwise within normal limits.  Did not send culture given lack of urinary symptoms.  Flagyl sent as below.  Self swab sent for BV, yeast, gonorrhea, chlamydia, trichomonas.  Declines HIV, RPR.  Safe sex precautions. ED return precautions discussed. Patient verbalizes understanding and agreement.  Coding this visit a level 4 for review of past labs and chart, ordering and review of tests today, and prescription drug management.  Final diagnoses:  Bacterial vaginosis  Negative pregnancy test     Discharge Instructions      -Flagyl (metronidazole) twice daily for 7 days.  If you have a sensitive stomach, take this with food to reduce the chance of nausea.  Avoid alcohol for the next 7 days as the combination will cause severe nausea and vomiting. -We have sent testing for BV, yeast, gonorrhea, chlamydia, trichomonas. We will notify you of any positive results once they are received. If required, we will prescribe any additional medications you might need. Please refrain from all sexual activity for at least the next seven days. Seek additional medical attention if you develop fevers/chills, new/worsening abdominal pain, new/worsening vaginal discomfort/discharge, etc.       ED Prescriptions     Medication Sig Dispense Auth. Provider   metroNIDAZOLE (FLAGYL) 500 MG tablet Take 1 tablet (500 mg total) by mouth 2 (two) times daily. 14 tablet Rhys Martini, PA-C      PDMP not reviewed this encounter.   Rhys Martini, PA-C 09/26/20 2001

## 2020-09-26 NOTE — ED Triage Notes (Signed)
Pt states she believes she has recurring BV.  C/O vaginal discharge x 2 days; c/o intermittent abd cramping x approx 4 days.  Denies fevers.

## 2020-09-26 NOTE — Discharge Instructions (Addendum)
-  Flagyl (metronidazole) twice daily for 7 days.  If you have a sensitive stomach, take this with food to reduce the chance of nausea.  Avoid alcohol for the next 7 days as the combination will cause severe nausea and vomiting. -We have sent testing for BV, yeast, gonorrhea, chlamydia, trichomonas. We will notify you of any positive results once they are received. If required, we will prescribe any additional medications you might need. Please refrain from all sexual activity for at least the next seven days. Seek additional medical attention if you develop fevers/chills, new/worsening abdominal pain, new/worsening vaginal discomfort/discharge, etc.

## 2020-09-28 LAB — CERVICOVAGINAL ANCILLARY ONLY
Bacterial Vaginitis (gardnerella): NEGATIVE
Candida Glabrata: NEGATIVE
Candida Vaginitis: NEGATIVE
Chlamydia: NEGATIVE
Comment: NEGATIVE
Comment: NEGATIVE
Comment: NEGATIVE
Comment: NEGATIVE
Comment: NEGATIVE
Comment: NORMAL
Neisseria Gonorrhea: NEGATIVE
Trichomonas: POSITIVE — AB

## 2020-10-12 ENCOUNTER — Ambulatory Visit
Admission: EM | Admit: 2020-10-12 | Discharge: 2020-10-12 | Disposition: A | Discharge status: Home / Self Care | Payer: Medicaid Other | Attending: Emergency Medicine | Admitting: Emergency Medicine

## 2020-10-12 DIAGNOSIS — B3731 Acute candidiasis of vulva and vagina: Secondary | ICD-10-CM

## 2020-10-12 DIAGNOSIS — N898 Other specified noninflammatory disorders of vagina: Secondary | ICD-10-CM | POA: Diagnosis not present

## 2020-10-12 DIAGNOSIS — B373 Candidiasis of vulva and vagina: Secondary | ICD-10-CM

## 2020-10-12 LAB — POCT URINALYSIS DIP (MANUAL ENTRY)
Bilirubin, UA: NEGATIVE
Blood, UA: NEGATIVE
Glucose, UA: NEGATIVE mg/dL
Ketones, POC UA: NEGATIVE mg/dL
Leukocytes, UA: NEGATIVE
Nitrite, UA: NEGATIVE
Protein Ur, POC: NEGATIVE mg/dL
Spec Grav, UA: 1.02 (ref 1.010–1.025)
Urobilinogen, UA: 0.2 E.U./dL
pH, UA: 6.5 (ref 5.0–8.0)

## 2020-10-12 LAB — POCT URINE PREGNANCY: Preg Test, Ur: NEGATIVE

## 2020-10-12 MED ORDER — FLUCONAZOLE 150 MG PO TABS: 150.0000 mg | ORAL_TABLET | ORAL | 0 refills | Status: DC

## 2020-10-12 NOTE — ED Provider Notes (Addendum)
[facility]    CSN: [id number] Arrival date & time: 10/12/20  1435      History   Chief Complaint Chief Complaint  Patient presents with   Vaginal Discharge    HPI [name] is a 35 y.o. female.   Patient presents with 3-day history of milky white to clear vaginal discharge and vaginal irritation.  Denies vaginal itching, urinary burning, urinary frequency, fever.  Is having some lower abdominal pain that is crampy in nature and constant.  Patient was seen on 09/26/20 and tested positive for trichomonas.  Was prescribed metronidazole for 7 days and has completed the course of antibiotics.  Patient states that symptoms and vaginal discharge resolved while on antibiotic and reappeared about 3 days ago.  States that vaginal discharge is slightly different as it is more white in color as opposed to the clear vaginal discharge that she was experiencing on 09/26/20.  Denies vaginal bleeding.  Last menstrual cycle was 2 weeks ago per patient.   Vaginal Discharge  Past Medical History:  Diagnosis Date   Bacterial vaginal infection    S/P cesarean section 08/26/2011    Patient Active Problem List   Diagnosis Date Noted   Abnormal uterine and vaginal bleeding, unspecified 07/21/2020   S/P cesarean section 08/26/2011    Past Surgical History:  Procedure Laterality Date   CESAREAN SECTION     Breech, 2nd FTD   CESAREAN SECTION  08/23/2011   Procedure: CESAREAN SECTION;  Surgeon: Bing Plume, MD;  Location: [facility];  Service: Gynecology;  Laterality: N/A;  Repeat    OB History     Gravida  4   Para  4   Term  4   Preterm      AB      Living  4      SAB      IAB      Ectopic      Multiple      Live Births  4            Home Medications    Prior to Admission medications   Medication Sig Start Date End Date Taking? Authorizing Provider  fluconazole (DIFLUCAN) 150 MG tablet Take 1 tablet (150 mg total) by mouth every 3 (three) days.  10/12/20  Yes Lance Muss, FNP  norgestimate-ethinyl estradiol (ORTHO-CYCLEN) 0.25-35 MG-MCG tablet Take 1 tablet by mouth daily. Patient not taking: Reported on 12/11/2019 05/18/19 12/24/19  Sharyon Cable, CNM    Family History Family History  Problem Relation Age of Onset   Healthy Mother    Healthy Father     Social History Social History   Tobacco Use   Smoking status: Never   Smokeless tobacco: Never  Vaping Use   Vaping Use: Never used  Substance Use Topics   Alcohol use: No   Drug use: Not Currently    Types: Marijuana    Comment: Last used over a year ago     Allergies   Peanut-containing drug products   Review of Systems Review of Systems  Genitourinary:  Positive for vaginal discharge.  Per HPI  Physical Exam Triage Vital Signs ED Triage Vitals  Enc Vitals Group     BP 10/12/20 1538 (!) 165/85     Pulse Rate 10/12/20 1538 71     Resp 10/12/20 1538 18     Temp 10/12/20 1538 97.8 F (36.6 C)     Temp Source 10/12/20 1538 Oral  SpO2 10/12/20 1538 99 %     Weight --      Height --      Head Circumference --      Peak Flow --      Pain Score 10/12/20 1539 0     Pain Loc --      Pain Edu? --      Excl. in GC? --    No data found.  Updated Vital Signs BP (!) 165/85 (BP Location: Left Arm)   Pulse 71   Temp 97.8 F (36.6 C) (Oral)   Resp 18   LMP 09/28/2020   SpO2 99%   Visual Acuity Right Eye Distance:   Left Eye Distance:   Bilateral Distance:    Right Eye Near:   Left Eye Near:    Bilateral Near:     Physical Exam Exam conducted with a chaperone present.  Constitutional:      Appearance: Normal appearance.  HENT:     Head: Normocephalic and atraumatic.  Eyes:     Extraocular Movements: Extraocular movements intact.     Conjunctiva/sclera: Conjunctivae normal.  Pulmonary:     Effort: Pulmonary effort is normal.  Genitourinary:    General: Normal vulva.     Pubic Area: No rash.      Labia:        Right: No rash,  tenderness or lesion.        Left: No rash, tenderness or lesion.      Vagina: Vaginal discharge present. No erythema, tenderness, bleeding or lesions.     Cervix: No friability or erythema.     Uterus: Normal.      Adnexa: Right adnexa normal.       Right: No tenderness.         Left: No tenderness.       Comments: No cervical motion tenderness.  No adnexal tenderness.  Color of the cervix is pink.  Milky white discharge with occasional yellow discoloration present.  Neurological:     General: No focal deficit present.     Mental Status: She is alert and oriented to person, place, and time. Mental status is at baseline.  Psychiatric:        Mood and Affect: Mood normal.        Behavior: Behavior normal.        Thought Content: Thought content normal.        Judgment: Judgment normal.     UC Treatments / Results  Labs (all labs ordered are listed, but only abnormal results are displayed) Labs Reviewed  URINE CULTURE  POCT URINE PREGNANCY  POCT URINALYSIS DIP (MANUAL ENTRY)  CERVICOVAGINAL ANCILLARY ONLY    EKG   Radiology No results found.  Procedures Procedures (including critical care time)  Medications Ordered in UC Medications - No data to display  Initial Impression / Assessment and Plan / UC Course  I have reviewed the triage vital signs and the nursing notes.  Pertinent labs & imaging results that were available during my care of the patient were reviewed by me and considered in my medical decision making (see chart for details).     Pelvic inflammatory disease does not appear to be present due to physical exam.  Vaginal discharge seems to be most consistent with possible vaginal candidiasis.  Will treat with Diflucan.  Vaginal swab retest pending to determine if treatment of trichomonas was successful.  Urine culture pending.  Patient advised to avoid sexual intercourse for at least  7 days.  Patient advised to go to the hospital if pelvic pain or abdominal  pain develops and/or worsens.  Monitor fevers at home and go to the hospital if fever is present. Discussed strict return precautions. Patient verbalized understanding and is agreeable with plan.  Final Clinical Impressions(s) / UC Diagnoses   Final diagnoses:  Vaginal candidiasis  Vaginal discharge     Discharge Instructions      You are being treated for suspected vaginal yeast infection due to multiple antibiotics over the past 2 months. Vaginal swab pending.  Will call with results of vaginal swab if treatment changes need to be made.  Please go to the hospital if pelvic pain or abdominal pain worsens.  Please monitor fevers at home.     ED Prescriptions     Medication Sig Dispense Auth. Provider   fluconazole (DIFLUCAN) 150 MG tablet Take 1 tablet (150 mg total) by mouth every 3 (three) days. 3 tablet Lance Muss, FNP      PDMP not reviewed this encounter.   Lance Muss, FNP 10/12/20 1641    Lance Muss, FNP 10/12/20 [phone]

## 2020-10-12 NOTE — ED Triage Notes (Signed)
Pt c/o clear mucus vaginal discharge with vaginal irritation x3 days. Denies odor or having unprotective intercourse.

## 2020-10-12 NOTE — Discharge Instructions (Signed)
You are being treated for suspected vaginal yeast infection due to multiple antibiotics over the past 2 months. Vaginal swab pending.  Will call with results of vaginal swab if treatment changes need to be made.  Please go to the hospital if pelvic pain or abdominal pain worsens.  Please monitor fevers at home.

## 2020-10-13 ENCOUNTER — Telehealth: Payer: Self-pay

## 2020-10-13 ENCOUNTER — Ambulatory Visit: Payer: Medicaid Other

## 2020-10-13 LAB — CERVICOVAGINAL ANCILLARY ONLY
Bacterial Vaginitis (gardnerella): NEGATIVE
Candida Glabrata: NEGATIVE
Candida Vaginitis: NEGATIVE
Chlamydia: NEGATIVE
Comment: NEGATIVE
Comment: NEGATIVE
Comment: NEGATIVE
Comment: NEGATIVE
Comment: NEGATIVE
Comment: NORMAL
Neisseria Gonorrhea: NEGATIVE
Trichomonas: NEGATIVE

## 2020-10-13 NOTE — Telephone Encounter (Signed)
Power outage, called patient to cancel/reschedule appointment, no answer, left voicemail 

## 2020-10-14 DIAGNOSIS — Z419 Encounter for procedure for purposes other than remedying health state, unspecified: Secondary | ICD-10-CM | POA: Diagnosis not present

## 2020-10-14 LAB — URINE CULTURE

## 2020-10-18 ENCOUNTER — Encounter: Payer: Self-pay | Admitting: *Deleted

## 2020-10-28 ENCOUNTER — Other Ambulatory Visit (HOSPITAL_COMMUNITY)
Admission: RE | Admit: 2020-10-28 | Discharge: 2020-10-28 | Disposition: A | Discharge status: Home / Self Care | Payer: Medicaid Other | Source: Ambulatory Visit | Attending: Obstetrics | Admitting: Obstetrics

## 2020-10-28 ENCOUNTER — Ambulatory Visit (INDEPENDENT_AMBULATORY_CARE_PROVIDER_SITE_OTHER): Payer: Medicaid Other | Admitting: Obstetrics

## 2020-10-28 ENCOUNTER — Encounter: Payer: Self-pay | Admitting: Obstetrics

## 2020-10-28 ENCOUNTER — Other Ambulatory Visit: Payer: Self-pay

## 2020-10-28 VITALS — BP 120/87 | HR 76 | Ht 69.0 in | Wt 332.0 lb

## 2020-10-28 DIAGNOSIS — Z6841 Body Mass Index (BMI) 40.0 and over, adult: Secondary | ICD-10-CM | POA: Diagnosis not present

## 2020-10-28 DIAGNOSIS — Z01419 Encounter for gynecological examination (general) (routine) without abnormal findings: Secondary | ICD-10-CM

## 2020-10-28 DIAGNOSIS — R300 Dysuria: Secondary | ICD-10-CM

## 2020-10-28 DIAGNOSIS — N898 Other specified noninflammatory disorders of vagina: Secondary | ICD-10-CM | POA: Insufficient documentation

## 2020-10-28 LAB — POCT URINALYSIS DIPSTICK
Bilirubin, UA: NEGATIVE
Glucose, UA: NEGATIVE
Ketones, UA: NEGATIVE
Leukocytes, UA: NEGATIVE
Nitrite, UA: NEGATIVE
Protein, UA: NEGATIVE
Spec Grav, UA: 1.02 (ref 1.010–1.025)
Urobilinogen, UA: 0.2 E.U./dL
pH, UA: 7 (ref 5.0–8.0)

## 2020-10-28 MED ORDER — FLUCONAZOLE 200 MG PO TABS: 200.0000 mg | ORAL_TABLET | ORAL | 2 refills | Status: DC

## 2020-10-28 NOTE — Progress Notes (Signed)
Subjective:        [name] is a 35 y.o. female here for a routine exam.  Current complaints: Vaginal itching and irritation.  Treated for Trichomonas a month ago.  Personal health questionnaire:  Is patient Ashkenazi Jewish, have a family history of breast and/or ovarian cancer: no Is there a family history of uterine cancer diagnosed at age < 83, gastrointestinal cancer, urinary tract cancer, family member who is a Personnel officer syndrome-associated carrier: no Is the patient overweight and hypertensive, family history of diabetes, personal history of gestational diabetes, preeclampsia or PCOS: no Is patient over 55, have PCOS,  family history of premature CHD under age 80, diabetes, smoke, have hypertension or peripheral artery disease:  no At any time, has a partner hit, kicked or otherwise hurt or frightened you?: no Over the past 2 weeks, have you felt down, depressed or hopeless?: no Over the past 2 weeks, have you felt little interest or pleasure in doing things?:no   Gynecologic History Patient's last menstrual period was 10/26/2020. Contraception: none Last Pap: 05-18-2019. Results were: n/a Last mammogram: n/a. Results were: n/a  Obstetric History OB History  Gravida Para Term Preterm AB Living  4 4 4     4   SAB IAB Ectopic Multiple Live Births          4    # Outcome Date GA Lbr Len/2nd Weight Sex Delivery Anes PTL Lv  4 Term 08/23/11 [redacted]w[redacted]d  8 lb 8.2 oz (3.86 kg) F CS-LTranv Spinal  LIV  3 Term 04/02/07    F CS-LTranv   LIV  2 Term 05/02/06    M CS-LTranv   LIV  1 Term 08/29/01    F Vag-Spont   LIV    Past Medical History:  Diagnosis Date   Bacterial vaginal infection    S/P cesarean section 08/26/2011    Past Surgical History:  Procedure Laterality Date   CESAREAN SECTION     Breech, 2nd FTD   CESAREAN SECTION  08/23/2011   Procedure: CESAREAN SECTION;  Surgeon: 10/23/2011, MD;  Location: [facility];  Service: Gynecology;  Laterality: N/A;  Repeat      Current Outpatient Medications:    fluconazole (DIFLUCAN) 150 MG tablet, Take 1 tablet (150 mg total) by mouth every 3 (three) days., Disp: 3 tablet, Rfl: 0 Allergies  Allergen Reactions   Peanut-Containing Drug Products Itching and Rash    All nuts    Social History   Tobacco Use   Smoking status: Never   Smokeless tobacco: Never  Substance Use Topics   Alcohol use: No    Family History  Problem Relation Age of Onset   Healthy Mother    Healthy Father       Review of Systems  Constitutional: negative for fatigue and weight loss Respiratory: negative for cough and wheezing Cardiovascular: negative for chest pain, fatigue and palpitations Gastrointestinal: negative for abdominal pain and change in bowel habits Musculoskeletal:negative for myalgias Neurological: negative for gait problems and tremors Behavioral/Psych: negative for abusive relationship, depression Endocrine: negative for temperature intolerance    Genitourinary:positive for vaginal discharge with irritation.  negative for abnormal menstrual periods, genital lesions, hot flashes, sexual problems  Integument/breast: negative for breast lump, breast tenderness, nipple discharge and skin lesion(s)    Objective:       BP 120/87   Pulse 76   Ht 5\' 9"  (1.753 m)   Wt (!) 332 lb (150.6 kg)   LMP 10/26/2020  BMI 49.03 kg/m  General:   Alert and no distress  Skin:   no rash or abnormalities  Lungs:   clear to auscultation bilaterally  Heart:   regular rate and rhythm, S1, S2 normal, no murmur, click, rub or gallop  Breasts:   normal without suspicious masses, skin or nipple changes or axillary nodes  Abdomen:  normal findings: no organomegaly, soft, non-tender and no hernia  Pelvis:  External genitalia: normal general appearance Urinary system: urethral meatus normal and bladder without fullness, nontender Vaginal: normal without tenderness, induration or masses Cervix: normal appearance Adnexa: normal  bimanual exam Uterus: anteverted and non-tender, normal size   Lab Review Urine pregnancy test Labs reviewed yes Radiologic studies reviewed no  I have spent a total of 20 minutes of face-to-face time, excluding clinical staff time, reviewing notes and preparing to see patient, ordering tests and/or medications, and counseling the patient.   Assessment:    1. Encounter for routine gynecological examination with Papanicolaou smear of cervix Rx: - Cytology - PAP( [place])  2. Dysuria Rx: - Urine Culture  3. Vaginal discharge Rx: - Cervicovaginal ancillary only( [place])  4. Vaginal irritation Rx: - fluconazole (DIFLUCAN) 200 MG tablet; Take 1 tablet (200 mg total) by mouth every 3 (three) days.  Dispense: 3 tablet; Refill: 2  5. Class 3 severe obesity due to excess calories without serious comorbidity with body mass index (BMI) of 45.0 to 49.9 in adult [facility])     Plan:    Education reviewed: calcium supplements, depression evaluation, low fat, low cholesterol diet, safe sex/STD prevention, self breast exams, and weight bearing exercise. Contraception: condoms. Follow up in: 1 year.   No orders of the defined types were placed in this encounter.  Orders Placed This Encounter  Procedures   Urine Culture     Brock Bad, MD 10/28/2020 10:57 AM

## 2020-10-28 NOTE — Addendum Note (Signed)
Addended by: Natale Milch D on: 10/28/2020 12:08 PM   Modules accepted: Orders

## 2020-10-28 NOTE — Progress Notes (Signed)
Pt is in the office complaining of recurrent BV Pt reports vaginal itching, discharge, odor and burning with urination. Reports a lot of cramping weeks before cycle started. LMP 10-26-20. Last pap 05-18-19

## 2020-10-30 LAB — URINE CULTURE

## 2020-10-31 LAB — CERVICOVAGINAL ANCILLARY ONLY
Bacterial Vaginitis (gardnerella): NEGATIVE
Candida Glabrata: NEGATIVE
Candida Vaginitis: NEGATIVE
Chlamydia: NEGATIVE
Comment: NEGATIVE
Comment: NEGATIVE
Comment: NEGATIVE
Comment: NEGATIVE
Comment: NEGATIVE
Comment: NORMAL
Neisseria Gonorrhea: NEGATIVE
Trichomonas: NEGATIVE

## 2020-11-02 LAB — CYTOLOGY - PAP
Comment: NEGATIVE
Diagnosis: NEGATIVE
High risk HPV: NEGATIVE

## 2020-11-14 DIAGNOSIS — Z419 Encounter for procedure for purposes other than remedying health state, unspecified: Secondary | ICD-10-CM | POA: Diagnosis not present

## 2020-12-06 ENCOUNTER — Encounter: Payer: Self-pay | Admitting: Obstetrics

## 2020-12-06 ENCOUNTER — Ambulatory Visit: Payer: Medicaid Other | Admitting: Obstetrics

## 2020-12-06 ENCOUNTER — Other Ambulatory Visit: Payer: Self-pay

## 2020-12-06 ENCOUNTER — Other Ambulatory Visit (HOSPITAL_COMMUNITY)
Admission: RE | Admit: 2020-12-06 | Discharge: 2020-12-06 | Disposition: A | Discharge status: Home / Self Care | Payer: Medicaid Other | Source: Ambulatory Visit | Attending: Obstetrics | Admitting: Obstetrics

## 2020-12-06 VITALS — BP 146/93 | HR 72 | Ht 69.0 in | Wt 346.0 lb

## 2020-12-06 DIAGNOSIS — R102 Pelvic and perineal pain: Secondary | ICD-10-CM

## 2020-12-06 DIAGNOSIS — N898 Other specified noninflammatory disorders of vagina: Secondary | ICD-10-CM

## 2020-12-06 DIAGNOSIS — B369 Superficial mycosis, unspecified: Secondary | ICD-10-CM

## 2020-12-06 DIAGNOSIS — Z6841 Body Mass Index (BMI) 40.0 and over, adult: Secondary | ICD-10-CM | POA: Diagnosis not present

## 2020-12-06 MED ORDER — FLUCONAZOLE 200 MG PO TABS: 200.0000 mg | ORAL_TABLET | ORAL | 2 refills | Status: DC

## 2020-12-06 MED ORDER — CLOTRIMAZOLE 1 % EX CREA: 85.0000 "application " | TOPICAL_CREAM | Freq: Two times a day (BID) | CUTANEOUS | 2 refills | Status: DC

## 2020-12-06 MED ORDER — CLOTRIMAZOLE 1 % EX CREA: 1.0000 "application " | TOPICAL_CREAM | Freq: Two times a day (BID) | CUTANEOUS | 0 refills | Status: DC

## 2020-12-06 NOTE — Progress Notes (Signed)
Reports clear vag dc, mild odor. Reports low abd cramping, right and left lower quadrants. Reports urinary frequency and "irritation". Cycles are generally regular, last one only lasted 2 day, reports mild to moderated blood flow.

## 2020-12-06 NOTE — Progress Notes (Signed)
Patient ID: [name], female   DOB: [birth year], 35 y.o.   MRN: [id number]  No chief complaint on file.   HPI [name] is a 35 y.o. female.  Complains of vaginal discharge with odor and irritation.  Also c/o pelvic burning type pain bilaterally. HPI  Past Medical History:  Diagnosis Date   Bacterial vaginal infection    S/P cesarean section 08/26/2011    Past Surgical History:  Procedure Laterality Date   CESAREAN SECTION     Breech, 2nd FTD   CESAREAN SECTION  08/23/2011   Procedure: CESAREAN SECTION;  Surgeon: Bing Plume, MD;  Location: [facility];  Service: Gynecology;  Laterality: N/A;  Repeat    Family History  Problem Relation Age of Onset   Healthy Mother    Healthy Father     Social History Social History   Tobacco Use   Smoking status: Never   Smokeless tobacco: Never  Vaping Use   Vaping Use: Never used  Substance Use Topics   Alcohol use: No   Drug use: Not Currently    Types: Marijuana    Comment: Last used over a year ago    Allergies  Allergen Reactions   Peanut-Containing Drug Products Itching and Rash    All nuts    Current Outpatient Medications  Medication Sig Dispense Refill   clotrimazole (LOTRIMIN) 1 % cream Apply 85 application topically 2 (two) times daily. 60 g 2   fluconazole (DIFLUCAN) 200 MG tablet Take 1 tablet (200 mg total) by mouth every 3 (three) days. 3 tablet 2   No current facility-administered medications for this visit.    Review of Systems Review of Systems Constitutional: negative for fatigue and weight loss Respiratory: negative for cough and wheezing Cardiovascular: negative for chest pain, fatigue and palpitations Gastrointestinal: negative for abdominal pain and change in bowel habits Genitourinary: positive for vaginal discharge and pelvic pain Integument/breast: negative for nipple discharge Musculoskeletal:negative for myalgias Neurological: negative for gait problems and  tremors Behavioral/Psych: negative for abusive relationship, depression Endocrine: negative for temperature intolerance      Blood pressure (!) 146/93, pulse 72, height 5\' 9"  (1.753 m), weight (!) 346 lb (156.9 kg), last menstrual period 11/20/2020.  Physical Exam Physical Exam General:   Alert and no distress  Skin:   no rash or abnormalities  Lungs:   clear to auscultation bilaterally  Heart:   regular rate and rhythm, S1, S2 normal, no murmur, click, rub or gallop  Breasts:   Not examined  Abdomen:  normal findings: no organomegaly, soft, non-tender and no hernia  Pelvis:  External genitalia: normal general appearance Urinary system: urethral meatus normal and bladder without fullness, nontender Vaginal: normal without tenderness, induration or masses Cervix: normal appearance Adnexa: normal bimanual exam Uterus: anteverted and non-tender, normal size    I have spent a total of 20 minutes of face-to-face time, excluding clinical staff time, reviewing notes and preparing to see patient, ordering tests and/or medications, and counseling the patient.   Data Reviewed Wet prep and cultures  Assessment     1. Vaginal discharge Rx: - Cervicovaginal ancillary only( [place])  2. Vaginal irritation Rx: - fluconazole (DIFLUCAN) 200 MG tablet; Take 1 tablet (200 mg total) by mouth every 3 (three) days.  Dispense: 3 tablet; Refill: 2  3. Pelvic pain Rx: - 01/20/2021 PELVIC COMPLETE WITH TRANSVAGINAL; Future  4. Superficial fungus infection of skin Rx: - clotrimazole (LOTRIMIN) 1 % cream; Apply 85 application topically 2 (  two) times daily.  Dispense: 60 g; Refill: 2  5. Class 3 severe obesity due to excess calories without serious comorbidity with body mass index (BMI) of 50.0 to 59.9 in adult (HCC) - weight reduction with the aid of dietary changes, exercise and behavioral modification recommended     Plan    Orders Placed This Encounter  Procedures   US PELVIC COMPLETE WITH  TRANSVAGINAL    Standing Status:   Future    Standing Expiration Date:   12/06/2021    Order Specific Question:   Reason for Exam (SYMPTOM  OR DIAGNOSIS REQUIRED)    Answer:   Pelvic pain    Order Specific Question:   Preferred imaging location?    Answer:   [facility]   Meds ordered this encounter  Medications   fluconazole (DIFLUCAN) 200 MG tablet    Sig: Take 1 tablet (200 mg total) by mouth every 3 (three) days.    Dispense:  3 tablet    Refill:  2   DISCONTD: clotrimazole (LOTRIMIN) 1 % cream    Sig: Apply 1 application topically 2 (two) times daily.    Dispense:  30 g    Refill:  0   clotrimazole (LOTRIMIN) 1 % cream    Sig: Apply 85 application topically 2 (two) times daily.    Dispense:  60 g    Refill:  2       Brock Bad, MD 12/06/2020 2:50 PM

## 2020-12-07 LAB — CERVICOVAGINAL ANCILLARY ONLY
Bacterial Vaginitis (gardnerella): NEGATIVE
Candida Glabrata: NEGATIVE
Candida Vaginitis: NEGATIVE
Chlamydia: NEGATIVE
Comment: NEGATIVE
Comment: NEGATIVE
Comment: NEGATIVE
Comment: NEGATIVE
Comment: NEGATIVE
Comment: NORMAL
Neisseria Gonorrhea: NEGATIVE
Trichomonas: NEGATIVE

## 2020-12-12 ENCOUNTER — Ambulatory Visit (HOSPITAL_BASED_OUTPATIENT_CLINIC_OR_DEPARTMENT_OTHER)
Admission: RE | Admit: 2020-12-12 | Discharge: 2020-12-12 | Disposition: A | Discharge status: Home / Self Care | Payer: Medicaid Other | Source: Ambulatory Visit | Attending: Obstetrics | Admitting: Obstetrics

## 2020-12-12 ENCOUNTER — Other Ambulatory Visit: Payer: Self-pay

## 2020-12-12 DIAGNOSIS — R102 Pelvic and perineal pain: Secondary | ICD-10-CM | POA: Diagnosis not present

## 2020-12-15 DIAGNOSIS — Z419 Encounter for procedure for purposes other than remedying health state, unspecified: Secondary | ICD-10-CM | POA: Diagnosis not present

## 2020-12-20 ENCOUNTER — Encounter: Payer: Self-pay | Admitting: Obstetrics

## 2020-12-20 ENCOUNTER — Telehealth (INDEPENDENT_AMBULATORY_CARE_PROVIDER_SITE_OTHER): Payer: Medicaid Other | Admitting: Obstetrics

## 2020-12-20 DIAGNOSIS — R102 Pelvic and perineal pain: Secondary | ICD-10-CM | POA: Diagnosis not present

## 2020-12-20 NOTE — Progress Notes (Signed)
Patient presents for a 2 week follow up my chart visit. Patient identified with 2 patient identifiers. Patient states that she is still cramping.

## 2020-12-20 NOTE — Progress Notes (Signed)
GYNECOLOGY VIRTUAL VISIT ENCOUNTER NOTE  Provider location: [facility] for [facility]   Patient location: Home  I connected with [name] on 12/20/20 at  4:10 PM EDT by MyChart Video Encounter and verified that I am speaking with the correct person using two identifiers.   I discussed the limitations, risks, security and privacy concerns of performing an evaluation and management service virtually and the availability of in person appointments. I also discussed with the patient that there may be a patient responsible charge related to this service. The patient expressed understanding and agreed to proceed.   History:  [name] is a 35 y.o. [phone] female being evaluated today for ultrasound results for pelvic pain. She denies any abnormal vaginal discharge, bleeding, or other concerns.  She states that the pelvic pain is much less.  Taking Ibuprofen prn.     Past Medical History:  Diagnosis Date   Bacterial vaginal infection    S/P cesarean section 08/26/2011   Past Surgical History:  Procedure Laterality Date   CESAREAN SECTION     Breech, 2nd FTD   CESAREAN SECTION  08/23/2011   Procedure: CESAREAN SECTION;  Surgeon: Bing Plume, MD;  Location: [facility];  Service: Gynecology;  Laterality: N/A;  Repeat   The following portions of the patient's history were reviewed and updated as appropriate: allergies, current medications, past family history, past medical history, past social history, past surgical history and problem list.   Health Maintenance:  Normal pap and negative HRHPV on 10-28-2020.    Review of Systems:  Pertinent items noted in HPI and remainder of comprehensive ROS otherwise negative.  Physical Exam:   General:  Alert, oriented and cooperative. Patient appears to be in no acute distress.  Mental Status: Normal mood and affect. Normal behavior. Normal judgment and thought content.   Respiratory: Normal respiratory effort, no problems  with respiration noted  Rest of physical exam deferred due to type of encounter  Labs and Imaging No results found for this or any previous visit (from the past 336 hour(s)). US PELVIC COMPLETE WITH TRANSVAGINAL  Result Date: 12/12/2020 CLINICAL DATA:  Pelvic pain, LMP in June 2022 date not provided EXAM: TRANSABDOMINAL AND TRANSVAGINAL ULTRASOUND OF PELVIS TECHNIQUE: Both transabdominal and transvaginal ultrasound examinations of the pelvis were performed. Transabdominal technique was performed for global imaging of the pelvis including uterus, ovaries, adnexal regions, and pelvic cul-de-sac. It was necessary to proceed with endovaginal exam following the transabdominal exam to visualize the ovaries. COMPARISON:  03/07/2020 FINDINGS: Uterus Measurements: 7.4 x 4.8 x 6.6 cm = volume: 121 mL. Retroflexed. Normal morphology without mass Endometrium Thickness: 9 mm. Trace nonspecific endometrial fluid at upper uterine segment. No mass. Right ovary Measurements: 3.4 x 1.4 x 3.6 cm = volume: 9.2 mL. Normal morphology without mass Left ovary Measurements: 4.1 x 3.2 x 2.5 cm = volume: 17.3 mL. Small resolving corpus luteum; no follow-up imaging recommended. Otherwise normal appearance. Other findings Trace free pelvic fluid.  No adnexal masses. IMPRESSION: Trace nonspecific endometrial fluid. No additional pelvic sonographic abnormalities. Electronically Signed   By: Ulyses Southward M.D.   On: 12/12/2020 12:07       Assessment and Plan:     1. Pelvic pain - ultrasound is WNL's - pain is much less.  Managed adequately with Ibuprofen prn.  Will follow clinically.      I discussed the assessment and treatment plan with the patient. The patient was provided an opportunity to  ask questions and all were answered. The patient agreed with the plan and demonstrated an understanding ofthe instructions.   The patient was advised to call back or seek an in-person evaluation/go to the ED if the symptoms worsen or if the  condition fails to improve as anticipated.  I have spent a total of 15 minutes of non-face-to-face time, excluding clinical staff time, reviewing notes and preparing to see patient, ordering tests and/or medications, and counseling the patient.    Coral Ceo, MD Center for [facility], [facility]  12/20/2020 5:05 PM

## 2021-01-14 DIAGNOSIS — Z419 Encounter for procedure for purposes other than remedying health state, unspecified: Secondary | ICD-10-CM | POA: Diagnosis not present

## 2021-02-14 DIAGNOSIS — Z419 Encounter for procedure for purposes other than remedying health state, unspecified: Secondary | ICD-10-CM | POA: Diagnosis not present

## 2021-03-16 DIAGNOSIS — Z419 Encounter for procedure for purposes other than remedying health state, unspecified: Secondary | ICD-10-CM | POA: Diagnosis not present

## 2021-04-16 DIAGNOSIS — Z419 Encounter for procedure for purposes other than remedying health state, unspecified: Secondary | ICD-10-CM | POA: Diagnosis not present

## 2021-05-17 DIAGNOSIS — Z419 Encounter for procedure for purposes other than remedying health state, unspecified: Secondary | ICD-10-CM | POA: Diagnosis not present

## 2021-06-14 DIAGNOSIS — Z419 Encounter for procedure for purposes other than remedying health state, unspecified: Secondary | ICD-10-CM | POA: Diagnosis not present

## 2021-07-15 DIAGNOSIS — Z419 Encounter for procedure for purposes other than remedying health state, unspecified: Secondary | ICD-10-CM | POA: Diagnosis not present

## 2021-07-18 ENCOUNTER — Other Ambulatory Visit (HOSPITAL_COMMUNITY)
Admission: RE | Admit: 2021-07-18 | Discharge: 2021-07-18 | Disposition: A | Discharge status: Home / Self Care | Payer: Medicaid Other | Source: Ambulatory Visit | Attending: Obstetrics | Admitting: Obstetrics

## 2021-07-18 ENCOUNTER — Ambulatory Visit (INDEPENDENT_AMBULATORY_CARE_PROVIDER_SITE_OTHER): Payer: Medicaid Other | Admitting: *Deleted

## 2021-07-18 DIAGNOSIS — N898 Other specified noninflammatory disorders of vagina: Secondary | ICD-10-CM

## 2021-07-18 NOTE — Progress Notes (Addendum)
SUBJECTIVE:  ?36 y.o. female complains of clear and thin vaginal discharge for 3 day(s). ?Denies abnormal vaginal bleeding or significant pelvic pain or ?fever. No UTI symptoms. Denies history of known exposure to STD. ? ?No LMP recorded. ? ?OBJECTIVE:  ?She appears well, afebrile. ?Urine dipstick: not done. ? ?ASSESSMENT:  ?Vaginal Discharge  ?Vaginal Odor ? ? ?PLAN:  ?GC, chlamydia, trichomonas, BVAG, CVAG probe sent to lab. ?Treatment: To be determined once lab results are received ?ROV prn if symptoms persist or worsen.  ? ?Patient was assessed and managed by nursing staff during this encounter. I have reviewed the chart and agree with the documentation and plan. I have also made any necessary editorial changes. ? ?Coral Ceo, MD ?07/19/2021 5:33 AM   ?

## 2021-07-20 ENCOUNTER — Ambulatory Visit
Admission: EM | Admit: 2021-07-20 | Discharge: 2021-07-20 | Disposition: A | Discharge status: Home / Self Care | Payer: Medicaid Other | Attending: Physician Assistant | Admitting: Physician Assistant

## 2021-07-20 DIAGNOSIS — N898 Other specified noninflammatory disorders of vagina: Secondary | ICD-10-CM

## 2021-07-20 LAB — CERVICOVAGINAL ANCILLARY ONLY
Bacterial Vaginitis (gardnerella): NEGATIVE
Candida Glabrata: NEGATIVE
Candida Vaginitis: NEGATIVE
Chlamydia: NEGATIVE
Comment: NEGATIVE
Comment: NEGATIVE
Comment: NEGATIVE
Comment: NEGATIVE
Comment: NEGATIVE
Comment: NORMAL
Neisseria Gonorrhea: NEGATIVE
Trichomonas: NEGATIVE

## 2021-07-20 MED ORDER — METRONIDAZOLE 500 MG PO TABS: 500.0000 mg | ORAL_TABLET | Freq: Two times a day (BID) | ORAL | 0 refills | Status: DC

## 2021-07-20 NOTE — ED Provider Notes (Signed)
?[facility] ? ? ? ?CSN: [id number] ?Arrival date & time: 07/20/21  1714 ? ? ?  ? ?History   ?Chief Complaint ?Chief Complaint  ?Patient presents with  ? Vaginal Discharge  ? ? ?HPI ?[name] is a 36 y.o. female.  ? ?Patient here today for evaluation of vaginal discharge that has been present for 3 days. She reports that she has had some lower abdominal cramping as well. She denies any dysuria or urinary frequency. She has had BV in the past and symptoms feel similar to same. She has not had fever. She denies any STD exposures or concerns for same.  ? ?The history is provided by the patient.  ?Vaginal Discharge ?Associated symptoms: abdominal pain   ?Associated symptoms: no dysuria, no fever, no nausea and no vomiting   ? ?Past Medical History:  ?Diagnosis Date  ? Bacterial vaginal infection   ? S/P cesarean section 08/26/2011  ? ? ?Patient Active Problem List  ? Diagnosis Date Noted  ? Abnormal uterine and vaginal bleeding, unspecified 07/21/2020  ? S/P cesarean section 08/26/2011  ? ? ?Past Surgical History:  ?Procedure Laterality Date  ? CESAREAN SECTION    ? Breech, 2nd FTD  ? CESAREAN SECTION  08/23/2011  ? Procedure: CESAREAN SECTION;  Surgeon: Melina Schools, MD;  Location: [facility];  Service: Gynecology;  Laterality: N/A;  Repeat  ? ? ?OB History   ? ? Gravida  ?4  ? Para  ?4  ? Term  ?4  ? Preterm  ?   ? AB  ?   ? Living  ?4  ?  ? ? SAB  ?   ? IAB  ?   ? Ectopic  ?   ? Multiple  ?   ? Live Births  ?4  ?   ?  ?  ? ? ? ?Home Medications   ? ?Prior to Admission medications   ?Medication Sig Start Date End Date Taking? Authorizing Provider  ?metroNIDAZOLE (FLAGYL) 500 MG tablet Take 1 tablet (500 mg total) by mouth 2 (two) times daily. 07/20/21  Yes Francene Finders, PA-C  ?clotrimazole (LOTRIMIN) 1 % cream Apply 85 application topically 2 (two) times daily. 12/06/20   Shelly Bombard, MD  ?norgestimate-ethinyl estradiol (ORTHO-CYCLEN) 0.25-35 MG-MCG tablet Take 1 tablet by mouth daily. ?Patient  not taking: Reported on 12/11/2019 05/18/19 12/24/19  Lajean Manes, CNM  ? ? ?Family History ?Family History  ?Problem Relation Age of Onset  ? Healthy Mother   ? Healthy Father   ? ? ?Social History ?Social History  ? ?Tobacco Use  ? Smoking status: Never  ? Smokeless tobacco: Never  ?Vaping Use  ? Vaping Use: Never used  ?Substance Use Topics  ? Alcohol use: No  ? Drug use: Not Currently  ?  Types: Marijuana  ?  Comment: Last used over a year ago  ? ? ? ?Allergies   ?Peanut-containing drug products ? ? ?Review of Systems ?Review of Systems  ?Constitutional:  Negative for chills and fever.  ?Eyes:  Negative for discharge and redness.  ?Gastrointestinal:  Positive for abdominal pain. Negative for nausea and vomiting.  ?Genitourinary:  Positive for vaginal discharge. Negative for dysuria and frequency.  ?Musculoskeletal:  Negative for back pain.  ? ? ?Physical Exam ?Triage Vital Signs ?ED Triage Vitals  ?Enc Vitals Group  ?   BP 07/20/21 1809 123/76  ?   Pulse Rate 07/20/21 1809 76  ?   Resp 07/20/21 1809  18  ?   Temp 07/20/21 1809 98.1 ?F (36.7 ?C)  ?   Temp Source 07/20/21 1809 Oral  ?   SpO2 07/20/21 1809 97 %  ?   Weight --   ?   Height --   ?   Head Circumference --   ?   Peak Flow --   ?   Pain Score 07/20/21 1810 9  ?   Pain Loc --   ?   Pain Edu? --   ?   Excl. in [place]? --   ? ?No data found. ? ?Updated Vital Signs ?BP 123/76 (BP Location: Left Arm)   Pulse 76   Temp 98.1 ?F (36.7 ?C) (Oral)   Resp 18   LMP 06/23/2021 (Approximate)   SpO2 97%  ? ?   ? ?Physical Exam ?Vitals and nursing note reviewed.  ?Constitutional:   ?   Appearance: Normal appearance.  ?HENT:  ?   Head: Normocephalic and atraumatic.  ?Eyes:  ?   Conjunctiva/sclera: Conjunctivae normal.  ?Cardiovascular:  ?   Rate and Rhythm: Normal rate.  ?Pulmonary:  ?   Effort: Pulmonary effort is normal.  ?Neurological:  ?   Mental Status: She is alert.  ?Psychiatric:     ?   Mood and Affect: Mood normal.     ?   Behavior: Behavior normal.   ? ? ? ?UC Treatments / Results  ?Labs ?(all labs ordered are listed, but only abnormal results are displayed) ?Labs Reviewed  ?CERVICOVAGINAL ANCILLARY ONLY  ? ? ?EKG ? ? ?Radiology ?No results found. ? ?Procedures ?Procedures (including critical care time) ? ?Medications Ordered in UC ?Medications - No data to display ? ?Initial Impression / Assessment and Plan / UC Course  ?I have reviewed the triage vital signs and the nursing notes. ? ?Pertinent labs & imaging results that were available during my care of the patient were reviewed by me and considered in my medical decision making (see chart for details). ? ? Will screen for BV, yeast, Gc, chlamydia and trich. Will treat for BV as patient reports that symptoms feel the same as prior BV infections. Encouraged follow up with any further concerns.  ? ?Final Clinical Impressions(s) / UC Diagnoses  ? ?Final diagnoses:  ?Vaginal discharge  ? ?Discharge Instructions   ?None ?  ? ?ED Prescriptions   ? ? Medication Sig Dispense Auth. Provider  ? metroNIDAZOLE (FLAGYL) 500 MG tablet Take 1 tablet (500 mg total) by mouth 2 (two) times daily. 14 tablet Francene Finders, PA-C  ? ?  ? ?PDMP not reviewed this encounter. ?  ?Francene Finders, PA-C ?07/20/21 1937 ? ?

## 2021-07-20 NOTE — ED Triage Notes (Signed)
3 day h/o vaginal discharge, irritation and lower abdominal cramping. Pt reports sxs are similar to BV. Notes some tingling when she urinates but denies burning sensation. No urinary frequency. No known STD exposures. ?

## 2021-07-24 LAB — CERVICOVAGINAL ANCILLARY ONLY
Bacterial Vaginitis (gardnerella): NEGATIVE
Candida Glabrata: NEGATIVE
Candida Vaginitis: NEGATIVE
Chlamydia: NEGATIVE
Comment: NEGATIVE
Comment: NEGATIVE
Comment: NEGATIVE
Comment: NEGATIVE
Comment: NEGATIVE
Comment: NORMAL
Neisseria Gonorrhea: NEGATIVE
Trichomonas: NEGATIVE

## 2021-08-14 DIAGNOSIS — Z419 Encounter for procedure for purposes other than remedying health state, unspecified: Secondary | ICD-10-CM | POA: Diagnosis not present

## 2021-09-14 DIAGNOSIS — Z419 Encounter for procedure for purposes other than remedying health state, unspecified: Secondary | ICD-10-CM | POA: Diagnosis not present

## 2021-10-14 DIAGNOSIS — Z419 Encounter for procedure for purposes other than remedying health state, unspecified: Secondary | ICD-10-CM | POA: Diagnosis not present

## 2021-11-14 DIAGNOSIS — Z419 Encounter for procedure for purposes other than remedying health state, unspecified: Secondary | ICD-10-CM | POA: Diagnosis not present

## 2021-12-15 DIAGNOSIS — Z419 Encounter for procedure for purposes other than remedying health state, unspecified: Secondary | ICD-10-CM | POA: Diagnosis not present

## 2022-01-03 ENCOUNTER — Ambulatory Visit (INDEPENDENT_AMBULATORY_CARE_PROVIDER_SITE_OTHER): Payer: Medicaid Other | Admitting: General Practice

## 2022-01-03 ENCOUNTER — Other Ambulatory Visit (HOSPITAL_COMMUNITY)
Admission: RE | Admit: 2022-01-03 | Discharge: 2022-01-03 | Disposition: A | Discharge status: Home / Self Care | Payer: Medicaid Other | Source: Ambulatory Visit | Attending: Obstetrics and Gynecology | Admitting: Obstetrics and Gynecology

## 2022-01-03 VITALS — BP 150/89 | HR 75 | Ht 69.0 in | Wt 361.2 lb

## 2022-01-03 DIAGNOSIS — N898 Other specified noninflammatory disorders of vagina: Secondary | ICD-10-CM | POA: Insufficient documentation

## 2022-01-03 MED ORDER — METRONIDAZOLE 500 MG PO TABS
500.0000 mg | ORAL_TABLET | Freq: Two times a day (BID) | ORAL | 0 refills | Status: DC
Start: 2022-01-03 — End: 2022-12-31

## 2022-01-03 NOTE — Progress Notes (Signed)
Patient was assessed and managed by nursing staff during this encounter. I have reviewed the chart and agree with the documentation and plan. I have also made any necessary editorial changes.  Mehdi Gironda A Spurgeon Gancarz, MD 01/03/2022 4:49 PM   

## 2022-01-03 NOTE — Progress Notes (Signed)
SUBJECTIVE:  35 y.o. female complains of white and malodorous vaginal discharge for 3 day(s). Denies abnormal vaginal bleeding or significant pelvic pain or fever. No UTI symptoms. Denies history of known exposure to STD.  LMP: 12-11-21  OBJECTIVE:  She appears well, afebrile. Urine dipstick: not done.  ASSESSMENT:  Vaginal Discharge  Vaginal Odor   PLAN:  GC, chlamydia, trichomonas, BVAG, CVAG probe sent to lab. Treatment: Metronidazole sent per protocol. Further treatment to be determined once lab results are received.  ROV prn if symptoms persist or worsen.

## 2022-01-04 ENCOUNTER — Other Ambulatory Visit: Payer: Self-pay

## 2022-01-04 DIAGNOSIS — B379 Candidiasis, unspecified: Secondary | ICD-10-CM

## 2022-01-04 LAB — CERVICOVAGINAL ANCILLARY ONLY
Bacterial Vaginitis (gardnerella): POSITIVE — AB
Candida Glabrata: NEGATIVE
Candida Vaginitis: POSITIVE — AB
Chlamydia: NEGATIVE
Comment: NEGATIVE
Comment: NEGATIVE
Comment: NEGATIVE
Comment: NEGATIVE
Comment: NEGATIVE
Comment: NORMAL
Neisseria Gonorrhea: NEGATIVE
Trichomonas: NEGATIVE

## 2022-01-04 MED ORDER — FLUCONAZOLE 150 MG PO TABS: 150.0000 mg | ORAL_TABLET | Freq: Once | ORAL | 0 refills | Status: AC

## 2022-01-14 DIAGNOSIS — Z419 Encounter for procedure for purposes other than remedying health state, unspecified: Secondary | ICD-10-CM | POA: Diagnosis not present

## 2022-02-14 ENCOUNTER — Other Ambulatory Visit (HOSPITAL_COMMUNITY)
Admission: RE | Admit: 2022-02-14 | Discharge: 2022-02-14 | Disposition: A | Discharge status: Home / Self Care | Payer: Medicaid Other | Source: Ambulatory Visit | Attending: Obstetrics | Admitting: Obstetrics

## 2022-02-14 ENCOUNTER — Encounter: Payer: Self-pay | Admitting: Obstetrics

## 2022-02-14 ENCOUNTER — Ambulatory Visit (INDEPENDENT_AMBULATORY_CARE_PROVIDER_SITE_OTHER): Payer: Medicaid Other | Admitting: Obstetrics

## 2022-02-14 VITALS — BP 136/85 | HR 74 | Ht 69.0 in | Wt 357.6 lb

## 2022-02-14 DIAGNOSIS — Z6841 Body Mass Index (BMI) 40.0 and over, adult: Secondary | ICD-10-CM

## 2022-02-14 DIAGNOSIS — N898 Other specified noninflammatory disorders of vagina: Secondary | ICD-10-CM | POA: Diagnosis not present

## 2022-02-14 DIAGNOSIS — Z01419 Encounter for gynecological examination (general) (routine) without abnormal findings: Secondary | ICD-10-CM | POA: Diagnosis not present

## 2022-02-14 DIAGNOSIS — Z419 Encounter for procedure for purposes other than remedying health state, unspecified: Secondary | ICD-10-CM | POA: Diagnosis not present

## 2022-02-14 DIAGNOSIS — N946 Dysmenorrhea, unspecified: Secondary | ICD-10-CM | POA: Diagnosis not present

## 2022-02-14 DIAGNOSIS — R350 Frequency of micturition: Secondary | ICD-10-CM

## 2022-02-14 LAB — POCT URINALYSIS DIPSTICK
Bilirubin, UA: NEGATIVE
Blood, UA: NEGATIVE
Glucose, UA: NEGATIVE
Ketones, UA: NEGATIVE
Leukocytes, UA: NEGATIVE
Nitrite, UA: NEGATIVE
Protein, UA: NEGATIVE
Spec Grav, UA: 1.01 (ref 1.010–1.025)
Urobilinogen, UA: 0.2 E.U./dL
pH, UA: 8 (ref 5.0–8.0)

## 2022-02-14 MED ORDER — METRONIDAZOLE 500 MG PO TABS: 500.0000 mg | ORAL_TABLET | Freq: Two times a day (BID) | ORAL | 2 refills | Status: DC

## 2022-02-14 MED ORDER — FLUCONAZOLE 150 MG PO TABS: 150.0000 mg | ORAL_TABLET | Freq: Once | ORAL | 0 refills | Status: AC

## 2022-02-14 MED ORDER — IBUPROFEN 800 MG PO TABS: 800.0000 mg | ORAL_TABLET | Freq: Three times a day (TID) | ORAL | 5 refills | Status: AC | PRN

## 2022-02-14 MED ORDER — CEFUROXIME AXETIL 500 MG PO TABS: 500.0000 mg | ORAL_TABLET | Freq: Two times a day (BID) | ORAL | 0 refills | Status: DC

## 2022-02-14 NOTE — Progress Notes (Signed)
Patient presents as a GYN with recurrent bv. Patient states that she gets bv about every other month. Sx did not resolve from last treatment in September. Also complains of having lower back pain and urinary frequency. Desires STD screening  Last Pap: 10/28/2020 Normal  Declined flu vaccine

## 2022-02-14 NOTE — Progress Notes (Signed)
Subjective:        [name] is a 36 y.o. female here for a routine exam.  Current complaints: Chronic BV.  Also complains of urinary frequency.  Personal health questionnaire:  Is patient Ashkenazi Jewish, have a family history of breast and/or ovarian cancer: no Is there a family history of uterine cancer diagnosed at age < 44, gastrointestinal cancer, urinary tract cancer, family member who is a Personnel officer syndrome-associated carrier: no Is the patient overweight and hypertensive, family history of diabetes, personal history of gestational diabetes, preeclampsia or PCOS: no Is patient over 15, have PCOS,  family history of premature CHD under age 67, diabetes, smoke, have hypertension or peripheral artery disease:  no At any time, has a partner hit, kicked or otherwise hurt or frightened you?: no Over the past 2 weeks, have you felt down, depressed or hopeless?: no Over the past 2 weeks, have you felt little interest or pleasure in doing things?:no   Gynecologic History Patient's last menstrual period was 01/22/2022. Contraception: none Last Pap: 2022. Results were: normal Last mammogram: n/a. Results were: n/a  Obstetric History OB History  Gravida Para Term Preterm AB Living  4 4 4     4   SAB IAB Ectopic Multiple Live Births          4    # Outcome Date GA Lbr Len/2nd Weight Sex Delivery Anes PTL Lv  4 Term 08/23/11 [redacted]w[redacted]d  8 lb 8.2 oz (3.86 kg) F CS-LTranv Spinal  LIV  3 Term 04/02/07    F CS-LTranv   LIV  2 Term 05/02/06    M CS-LTranv   LIV  1 Term 08/29/01    F Vag-Spont   LIV    Past Medical History:  Diagnosis Date   Bacterial vaginal infection    S/P cesarean section 08/26/2011    Past Surgical History:  Procedure Laterality Date   CESAREAN SECTION     Breech, 2nd FTD   CESAREAN SECTION  08/23/2011   Procedure: CESAREAN SECTION;  Surgeon: 10/23/2011, MD;  Location: [facility];  Service: Gynecology;  Laterality: N/A;  Repeat     Current Outpatient  Medications:    cefUROXime (CEFTIN) 500 MG tablet, Take 1 tablet (500 mg total) by mouth 2 (two) times daily with a meal., Disp: 14 tablet, Rfl: 0   fluconazole (DIFLUCAN) 150 MG tablet, Take 1 tablet (150 mg total) by mouth once for 1 dose., Disp: 1 tablet, Rfl: 0   ibuprofen (ADVIL) 800 MG tablet, Take 1 tablet (800 mg total) by mouth every 8 (eight) hours as needed., Disp: 30 tablet, Rfl: 5   metroNIDAZOLE (FLAGYL) 500 MG tablet, Take 1 tablet (500 mg total) by mouth 2 (two) times daily., Disp: 14 tablet, Rfl: 2   Ascorbic Acid (VITAMIN C) 100 MG tablet, Take 100 mg by mouth daily. (Patient not taking: Reported on 02/14/2022), Disp: , Rfl:    clotrimazole (LOTRIMIN) 1 % cream, Apply 85 application topically 2 (two) times daily. (Patient not taking: Reported on 01/03/2022), Disp: 60 g, Rfl: 2   metroNIDAZOLE (FLAGYL) 500 MG tablet, Take 1 tablet (500 mg total) by mouth 2 (two) times daily., Disp: 14 tablet, Rfl: 0 Allergies  Allergen Reactions   Peanut-Containing Drug Products Itching and Rash    All nuts    Social History   Tobacco Use   Smoking status: Never   Smokeless tobacco: Never  Substance Use Topics   Alcohol use: No  Family History  Problem Relation Age of Onset   Healthy Mother    Healthy Father       Review of Systems  Constitutional: negative for fatigue and weight loss Respiratory: negative for cough and wheezing Cardiovascular: negative for chest pain, fatigue and palpitations Gastrointestinal: negative for abdominal pain and change in bowel habits Musculoskeletal:negative for myalgias Neurological: negative for gait problems and tremors Behavioral/Psych: negative for abusive relationship, depression Endocrine: negative for temperature intolerance    Genitourinary:positive for vaginal discharge and urinary frequency.  negative for abnormal menstrual periods, genital lesions, hot flashes, sexual problems  Integument/breast: negative for breast lump, breast  tenderness, nipple discharge and skin lesion(s)    Objective:       BP 136/85   Pulse 74   Ht 5\' 9"  (1.753 m)   Wt (!) 357 lb 9.6 oz (162.2 kg)   LMP 01/22/2022   BMI 52.81 kg/m  General:   Alert and no distress  Skin:   no rash or abnormalities  Lungs:   clear to auscultation bilaterally  Heart:   regular rate and rhythm, S1, S2 normal, no murmur, click, rub or gallop  Breasts:   normal without suspicious masses, skin or nipple changes or axillary nodes  Abdomen:  normal findings: no organomegaly, soft, non-tender and no hernia  Pelvis:  External genitalia: normal general appearance Urinary system: urethral meatus normal and bladder without fullness, nontender Vaginal: normal without tenderness, induration or masses Cervix: normal appearance Adnexa: normal bimanual exam Uterus: anteverted and non-tender, normal size   Lab Review Urine pregnancy test Labs reviewed yes Radiologic studies reviewed no  I have spent a total of 20 minutes of face-to-face time, excluding clinical staff time, reviewing notes and preparing to see patient, ordering tests and/or medications, and counseling the patient.   Assessment:    1. Encounter for routine gynecological examination with Papanicolaou smear of cervix Rx: - Cytology - PAP( [facility])  2. Dysmenorrhea Rx: - ibuprofen (ADVIL) 800 MG tablet; Take 1 tablet (800 mg total) by mouth every 8 (eight) hours as needed.  Dispense: 30 tablet; Refill: 5  3. Vaginal discharge Rx: - Cervicovaginal ancillary only( [facility]) - metroNIDAZOLE (FLAGYL) 500 MG tablet; Take 1 tablet (500 mg total) by mouth 2 (two) times daily.  Dispense: 14 tablet; Refill: 2  4. Urinary frequency Rx: - POCT Urinalysis Dipstick - Urine Culture - cefUROXime (CEFTIN) 500 MG tablet; Take 1 tablet (500 mg total) by mouth 2 (two) times daily with a meal.  Dispense: 14 tablet; Refill: 0  5. Class 3 severe obesity due to excess calories without serious comorbidity  with body mass index (BMI) of 50.0 to 59.9 in adult ([place]) - weight reduction recommended     Plan:    Follow up in 1 year Orders Placed This Encounter  Procedures   Urine Culture   POCT Urinalysis Dipstick     Shelly Bombard, MD 02/14/2022 10:27 AM

## 2022-02-15 LAB — CERVICOVAGINAL ANCILLARY ONLY
Bacterial Vaginitis (gardnerella): NEGATIVE
Candida Glabrata: NEGATIVE
Candida Vaginitis: NEGATIVE
Chlamydia: NEGATIVE
Comment: NEGATIVE
Comment: NEGATIVE
Comment: NEGATIVE
Comment: NEGATIVE
Comment: NEGATIVE
Comment: NORMAL
Neisseria Gonorrhea: NEGATIVE
Trichomonas: NEGATIVE

## 2022-02-15 LAB — CYTOLOGY - PAP: Diagnosis: NEGATIVE

## 2022-02-17 LAB — URINE CULTURE

## 2022-03-16 DIAGNOSIS — Z419 Encounter for procedure for purposes other than remedying health state, unspecified: Secondary | ICD-10-CM | POA: Diagnosis not present

## 2022-04-16 DIAGNOSIS — Z419 Encounter for procedure for purposes other than remedying health state, unspecified: Secondary | ICD-10-CM | POA: Diagnosis not present

## 2022-05-07 DIAGNOSIS — Z03818 Encounter for observation for suspected exposure to other biological agents ruled out: Secondary | ICD-10-CM | POA: Diagnosis not present

## 2022-05-07 DIAGNOSIS — Z20822 Contact with and (suspected) exposure to covid-19: Secondary | ICD-10-CM | POA: Diagnosis not present

## 2022-05-07 DIAGNOSIS — Z6841 Body Mass Index (BMI) 40.0 and over, adult: Secondary | ICD-10-CM | POA: Diagnosis not present

## 2022-05-17 DIAGNOSIS — Z419 Encounter for procedure for purposes other than remedying health state, unspecified: Secondary | ICD-10-CM | POA: Diagnosis not present

## 2022-06-15 DIAGNOSIS — Z419 Encounter for procedure for purposes other than remedying health state, unspecified: Secondary | ICD-10-CM | POA: Diagnosis not present

## 2022-07-16 DIAGNOSIS — Z419 Encounter for procedure for purposes other than remedying health state, unspecified: Secondary | ICD-10-CM | POA: Diagnosis not present

## 2022-08-15 DIAGNOSIS — Z419 Encounter for procedure for purposes other than remedying health state, unspecified: Secondary | ICD-10-CM | POA: Diagnosis not present

## 2022-08-24 IMAGING — US US PELVIS COMPLETE WITH TRANSVAGINAL
1 series · 15 of 25 positions shown · non-contrast
Comparison: None

CLINICAL DATA: Pelvic pain of unspecified duration, LMP 01/25/2020.
Past history of Caesarean section; G4P4

EXAM:
TRANSABDOMINAL AND TRANSVAGINAL ULTRASOUND OF PELVIS
TECHNIQUE: Both transabdominal and transvaginal ultrasound examinations of the
pelvis were performed. Transabdominal technique was performed for
global imaging of the pelvis including uterus, ovaries, adnexal
regions, and pelvic cul-de-sac. It was necessary to proceed with
endovaginal exam following the transabdominal exam to visualize the
uterine fundus and endometrium.

[Series 1: us pelvis complete with transvaginal · 15 of 115 slices shown]
[im 1/115]
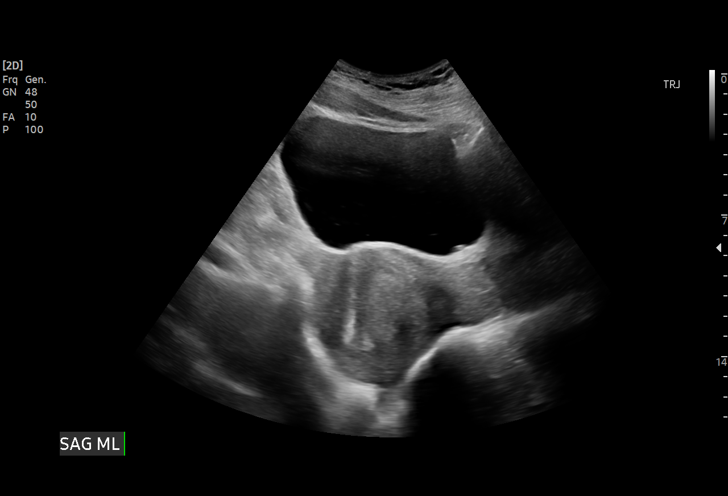
[im 10/115]
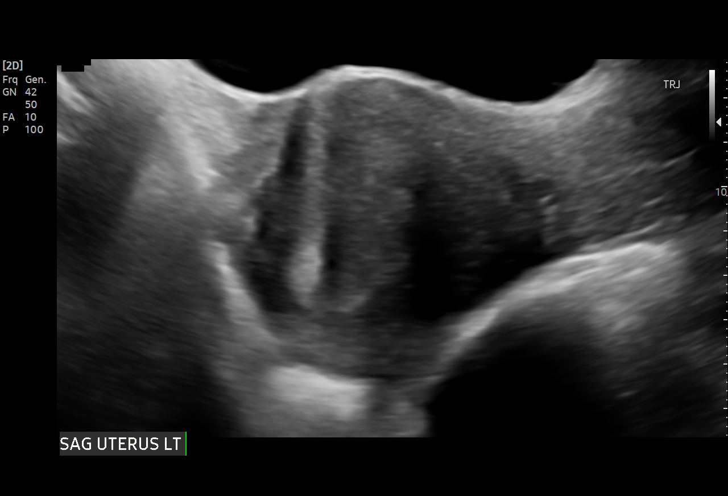
[im 20/115]
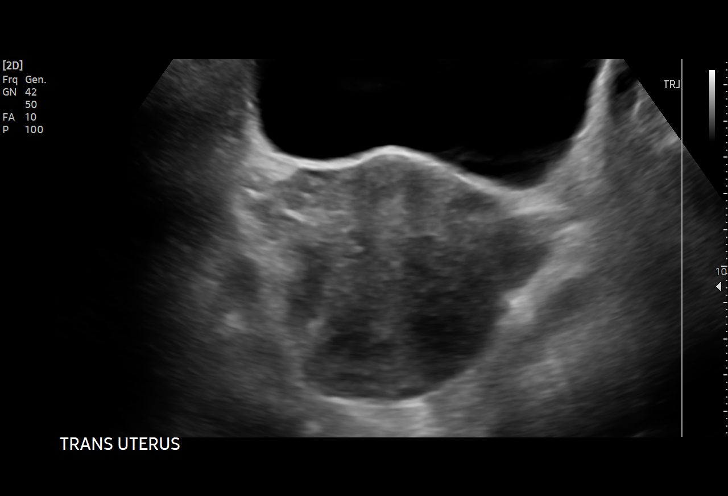
[im 24/115]
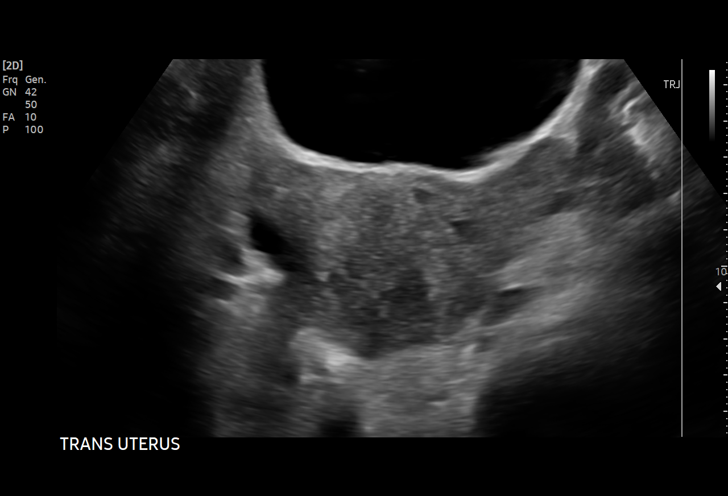
[im 34/115]
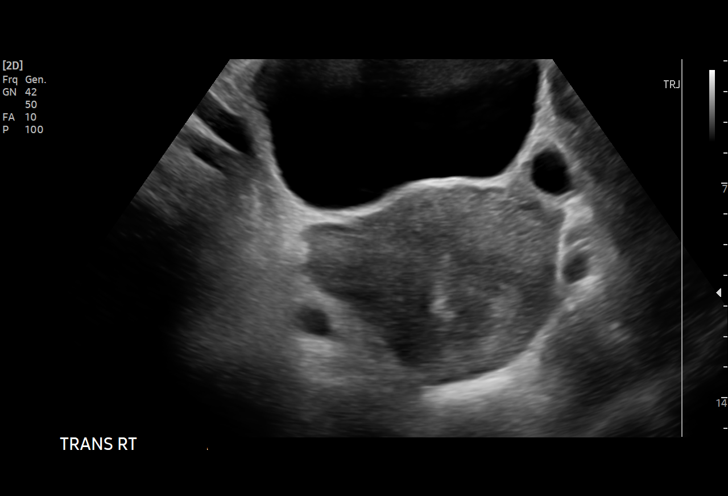
[im 43/115]
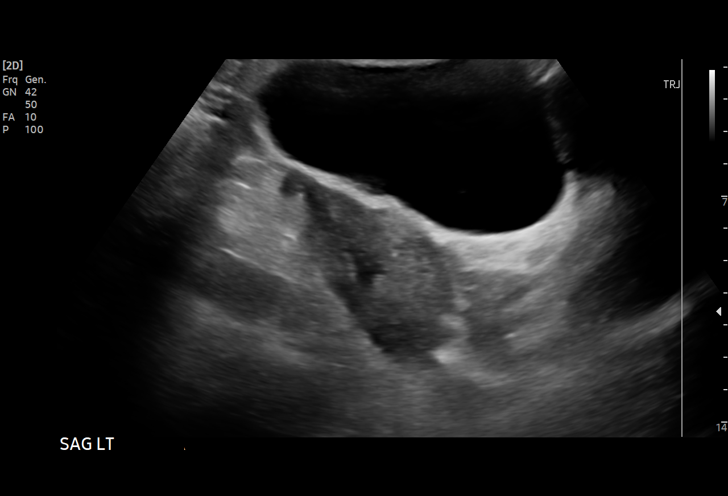
[im 48/115]
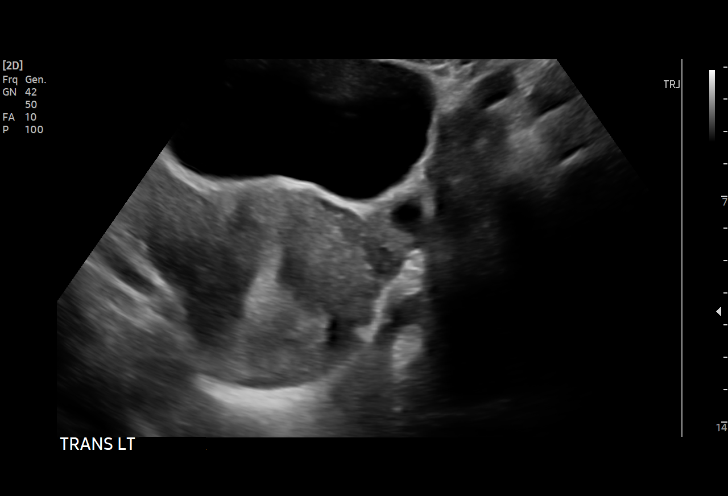
[im 58/115]
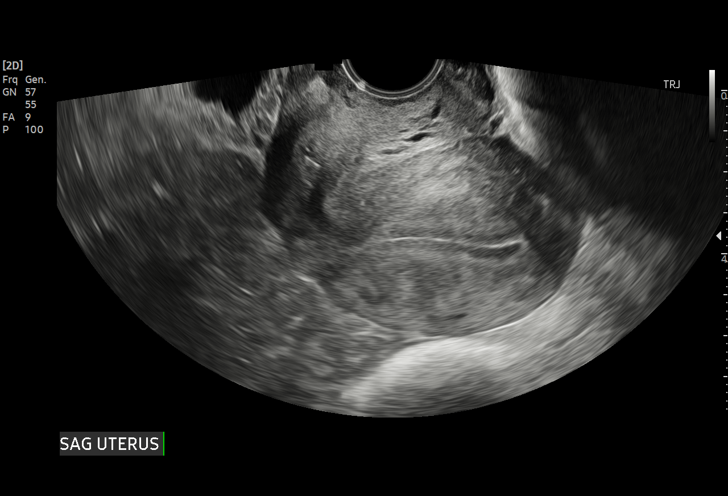
[im 67/115]
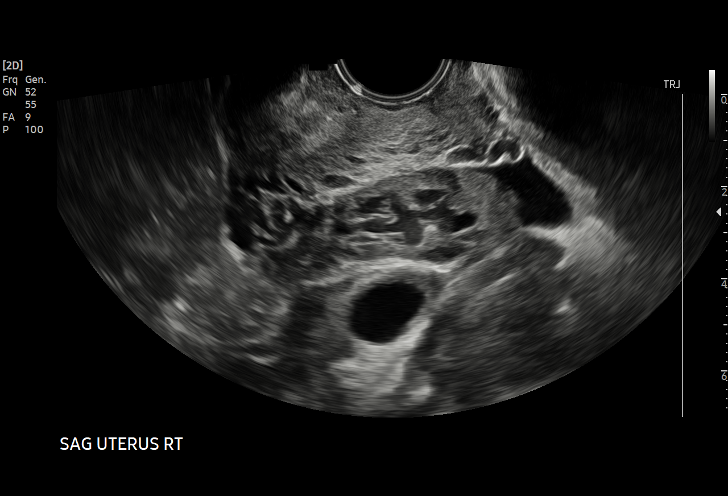
[im 72/115]
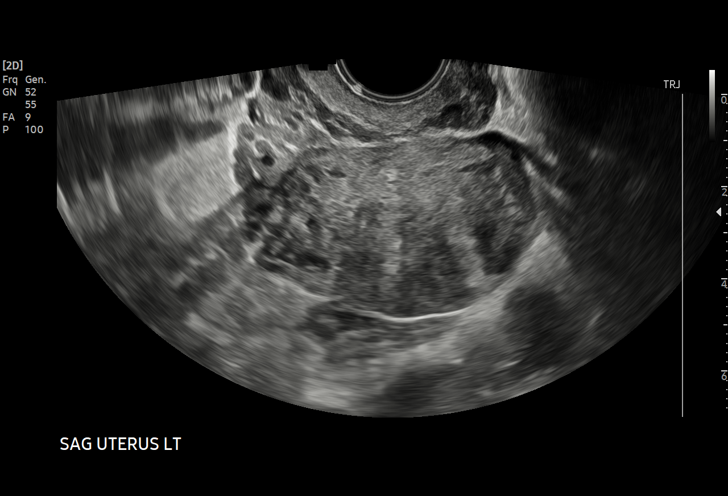
[im 81/115]
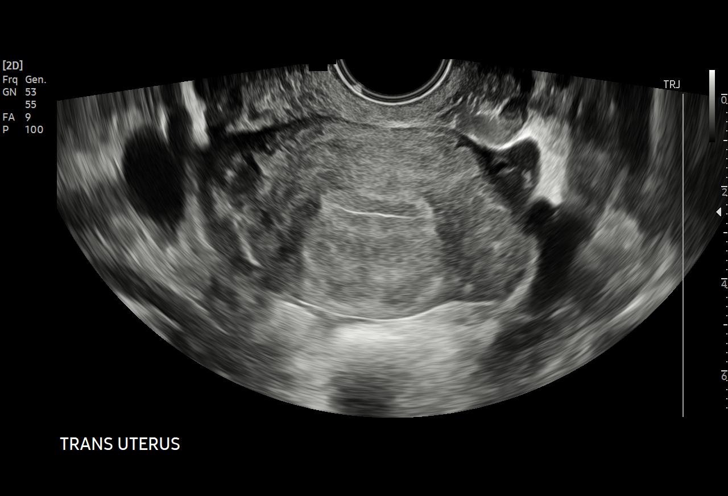
[im 91/115]
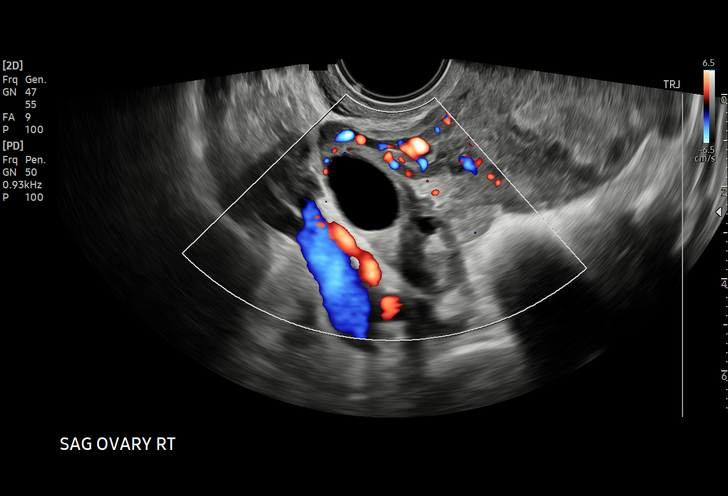
[im 96/115]
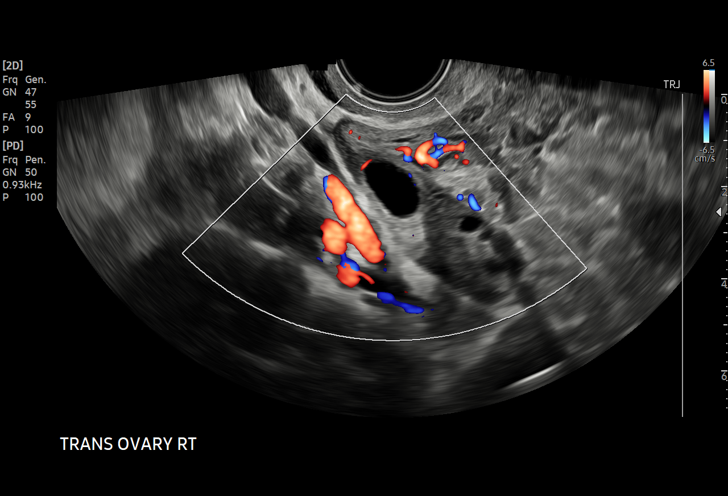
[im 105/115]
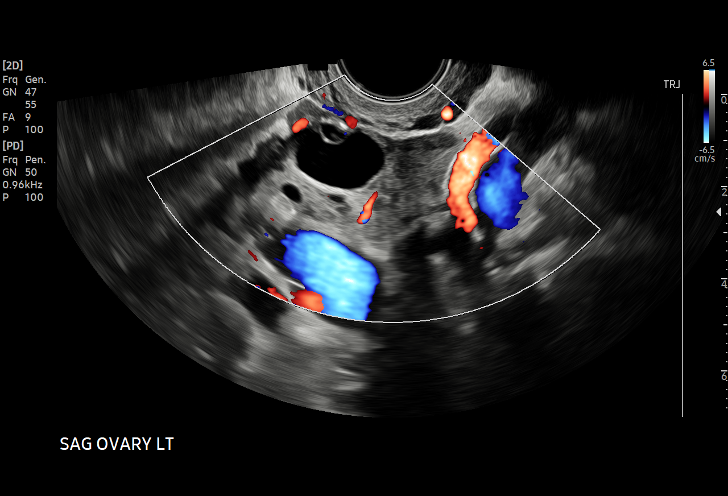
[im 115/115]
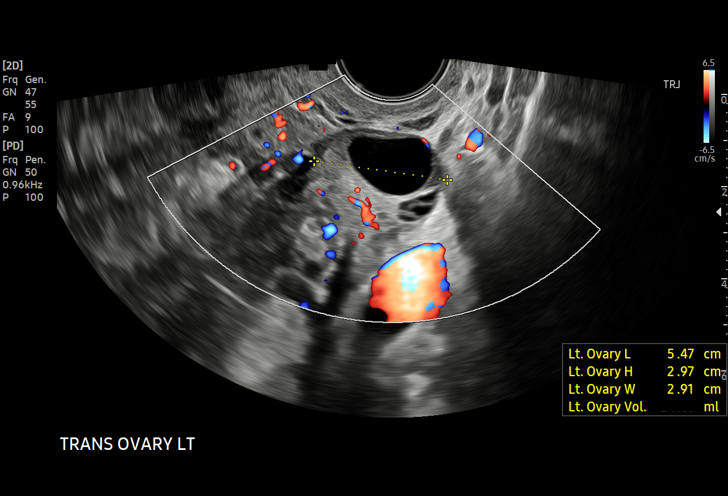

[15 of 25 positions shown; findings below may reference images not displayed]

FINDINGS: Uterus

Measurements: 10.8 x 5.3 x 6.3 cm = volume: 189 mL. Retroflexed.
Otherwise normal morphology without mass.

Endometrium

Thickness: 13 mm.  No endometrial fluid or focal abnormality

Right ovary

Measurements: 4.6 x 1.5 x 2.1 cm = volume: 7.6 mL. Normal morphology
without mass

Left ovary

Measurements: 5.5 x 3.0 x 2.9 cm = volume: 24.7 mL. Normal
morphology without mass

Other findings

Trace free pelvic fluid.  No adnexal masses.
IMPRESSION: No pelvic sonographic abnormalities identified.

## 2022-09-15 DIAGNOSIS — Z419 Encounter for procedure for purposes other than remedying health state, unspecified: Secondary | ICD-10-CM | POA: Diagnosis not present

## 2022-09-19 ENCOUNTER — Encounter: Payer: Self-pay | Admitting: Obstetrics and Gynecology

## 2022-09-19 ENCOUNTER — Ambulatory Visit (INDEPENDENT_AMBULATORY_CARE_PROVIDER_SITE_OTHER): Payer: Medicaid Other | Admitting: Obstetrics and Gynecology

## 2022-09-19 ENCOUNTER — Other Ambulatory Visit (HOSPITAL_COMMUNITY)
Admission: RE | Admit: 2022-09-19 | Discharge: 2022-09-19 | Disposition: A | Discharge status: Home / Self Care | Payer: Medicaid Other | Source: Ambulatory Visit | Attending: Obstetrics and Gynecology | Admitting: Obstetrics and Gynecology

## 2022-09-19 VITALS — BP 128/70 | HR 87 | Ht 69.0 in | Wt 360.9 lb

## 2022-09-19 DIAGNOSIS — N760 Acute vaginitis: Secondary | ICD-10-CM | POA: Diagnosis not present

## 2022-09-19 MED ORDER — FLUCONAZOLE 150 MG PO TABS: 150.0000 mg | ORAL_TABLET | Freq: Once | ORAL | 0 refills | Status: AC

## 2022-09-19 MED ORDER — NYSTATIN-TRIAMCINOLONE 100000-0.1 UNIT/GM-% EX OINT: 1.0000 | TOPICAL_OINTMENT | Freq: Two times a day (BID) | CUTANEOUS | 0 refills | Status: AC

## 2022-09-19 NOTE — Progress Notes (Signed)
37 yo P4 here for the evaluation of 4-day history of vaginitis. She reports the presence of a clear pruritic discharge with odor. Patient also reports some dysuria recently. Patient reports more than 3-4 recurrent BV infections per year. She is sexually active and reports a monthly period. She is not using anything for contraception as she did not like the hormonal effects. Patient is without any other complaints  Past Medical History:  Diagnosis Date   Bacterial vaginal infection    S/P cesarean section 08/26/2011   Past Surgical History:  Procedure Laterality Date   CESAREAN SECTION     Breech, 2nd FTD   CESAREAN SECTION  08/23/2011   Procedure: CESAREAN SECTION;  Surgeon: Bing Plume, MD;  Location: [facility];  Service: Gynecology;  Laterality: N/A;  Repeat   Family History  Problem Relation Age of Onset   Healthy Mother    Healthy Father    Social History   Tobacco Use   Smoking status: Never   Smokeless tobacco: Never  Vaping Use   Vaping Use: Never used  Substance Use Topics   Alcohol use: No   Drug use: Not Currently    Types: Marijuana    Comment: Last used over a year ago   ROS See pertinent in HPI. All other systems reviewed and non contributory Blood pressure 128/70, pulse 87, height 5\' 9"  (1.753 m), weight (!) 360 lb 14.4 oz (163.7 kg), last menstrual period 08/23/2022. GENERAL: Well-developed, well-nourished female in no acute distress.  ABDOMEN: Soft, nontender, nondistended. No organomegaly. PELVIC: Normal external female genitalia. Vagina is pink and rugated.  Normal discharge. Normal appearing cervix. Uterus is normal in size. No adnexal mass or tenderness. Chaperone present during the pelvic exam EXTREMITIES: No cyanosis, clubbing, or edema, 2+ distal pulses.  A/P 37 yo with vaginitis - vaginal swab collected - urine culture collected - perineal care instructions reviewed with the patient to decrease BV recurrence - rx diflucan and nystatin provided -  patient will be contacted with abnormal results

## 2022-09-19 NOTE — Progress Notes (Signed)
Pt presents with c/o itching, clear liquid discharge with odor and pelvic pain x 4 days. Discomfort with urination. Also notes itchy rash on face that she believes is yeast.

## 2022-09-20 LAB — CERVICOVAGINAL ANCILLARY ONLY
Bacterial Vaginitis (gardnerella): NEGATIVE
Candida Glabrata: NEGATIVE
Candida Vaginitis: NEGATIVE
Chlamydia: NEGATIVE
Comment: NEGATIVE
Comment: NEGATIVE
Comment: NEGATIVE
Comment: NEGATIVE
Comment: NEGATIVE
Comment: NORMAL
Neisseria Gonorrhea: NEGATIVE
Trichomonas: NEGATIVE

## 2022-09-22 LAB — URINE CULTURE

## 2022-10-15 DIAGNOSIS — Z419 Encounter for procedure for purposes other than remedying health state, unspecified: Secondary | ICD-10-CM | POA: Diagnosis not present

## 2022-11-15 DIAGNOSIS — Z419 Encounter for procedure for purposes other than remedying health state, unspecified: Secondary | ICD-10-CM | POA: Diagnosis not present

## 2022-12-16 DIAGNOSIS — Z419 Encounter for procedure for purposes other than remedying health state, unspecified: Secondary | ICD-10-CM | POA: Diagnosis not present

## 2022-12-31 ENCOUNTER — Other Ambulatory Visit (HOSPITAL_COMMUNITY)
Admission: RE | Admit: 2022-12-31 | Discharge: 2022-12-31 | Disposition: A | Discharge status: Home / Self Care | Payer: Medicaid Other | Source: Ambulatory Visit | Attending: Obstetrics and Gynecology | Admitting: Obstetrics and Gynecology

## 2022-12-31 ENCOUNTER — Encounter: Payer: Self-pay | Admitting: Obstetrics and Gynecology

## 2022-12-31 ENCOUNTER — Ambulatory Visit (INDEPENDENT_AMBULATORY_CARE_PROVIDER_SITE_OTHER): Payer: Medicaid Other | Admitting: Obstetrics and Gynecology

## 2022-12-31 VITALS — BP 119/75 | HR 76 | Ht 69.0 in | Wt 356.0 lb

## 2022-12-31 DIAGNOSIS — R1031 Right lower quadrant pain: Secondary | ICD-10-CM | POA: Insufficient documentation

## 2022-12-31 DIAGNOSIS — Z6841 Body Mass Index (BMI) 40.0 and over, adult: Secondary | ICD-10-CM | POA: Diagnosis not present

## 2022-12-31 NOTE — Progress Notes (Signed)
37 yo P4 with BMI 52 and LMP 12/17/22 presenting for the evaluation of RLQ pain. Patient reports onset of pain 2 weeks ago. She reports associated pelvic cramping pain. She reports a monthly period lasting 5-7 days. She is sexually active with last sexual encounter in July. She reports tylenol helps with the pain but it soon returns. She is without any other complaints  Past Medical History:  Diagnosis Date   Bacterial vaginal infection    S/P cesarean section 08/26/2011   Past Surgical History:  Procedure Laterality Date   CESAREAN SECTION     Breech, 2nd FTD   CESAREAN SECTION  08/23/2011   Procedure: CESAREAN SECTION;  Surgeon: Bing Plume, MD;  Location: [facility];  Service: Gynecology;  Laterality: N/A;  Repeat   Family History  Problem Relation Age of Onset   Healthy Mother    Healthy Father    Social History   Tobacco Use   Smoking status: Never   Smokeless tobacco: Never  Vaping Use   Vaping status: Never Used  Substance Use Topics   Alcohol use: No   Drug use: Not Currently    Types: Marijuana    Comment: Last used over a year ago   ROS See pertinent in HPI. All other systems reviewed and non contributory Blood pressure 119/75, pulse 76, height 5\' 9"  (1.753 m), weight (!) 356 lb (161.5 kg), last menstrual period 12/17/2022. GENERAL: Well-developed, well-nourished female in no acute distress.  ABDOMEN: Soft, nontender, nondistended. No organomegaly. PELVIC: Normal external female genitalia. Vagina is pink and rugated.  Normal discharge. Normal appearing cervix. Uterus is normal in size. No adnexal mass or tenderness. Chaperone present during the pelvic exam EXTREMITIES: No cyanosis, clubbing, or edema, 2+ distal pulses.  A/P 37 yo with RLQ pain - vaginal swab collected to rule out infectious process - pelvic ultrasound ordered to rule out ovarian cyst or other possible etiologies - Advised patient to take NSAID instead - Patient has started the process of losing  weight by decreasing her bread consumption and increasing her fluid intake for the past week. Patient agreed to a referral to nutritionist - Patient will be contacted with results

## 2022-12-31 NOTE — Progress Notes (Signed)
Pt is in office today for menstrual cramping. Pt states that cycles are irregular and heavy. Pt complains of pain on lower right side, ?cyst.

## 2023-01-01 LAB — CERVICOVAGINAL ANCILLARY ONLY
Bacterial Vaginitis (gardnerella): NEGATIVE
Candida Glabrata: NEGATIVE
Candida Vaginitis: NEGATIVE
Chlamydia: NEGATIVE
Comment: NEGATIVE
Comment: NEGATIVE
Comment: NEGATIVE
Comment: NEGATIVE
Comment: NEGATIVE
Comment: NORMAL
Neisseria Gonorrhea: NEGATIVE
Trichomonas: NEGATIVE

## 2023-01-02 ENCOUNTER — Ambulatory Visit (HOSPITAL_BASED_OUTPATIENT_CLINIC_OR_DEPARTMENT_OTHER)
Admission: RE | Admit: 2023-01-02 | Discharge: 2023-01-02 | Disposition: A | Discharge status: Home / Self Care | Payer: Medicaid Other | Source: Ambulatory Visit | Attending: Obstetrics and Gynecology | Admitting: Obstetrics and Gynecology

## 2023-01-02 DIAGNOSIS — R1031 Right lower quadrant pain: Secondary | ICD-10-CM | POA: Diagnosis not present

## 2023-01-02 DIAGNOSIS — R102 Pelvic and perineal pain: Secondary | ICD-10-CM | POA: Diagnosis not present

## 2023-01-15 DIAGNOSIS — Z419 Encounter for procedure for purposes other than remedying health state, unspecified: Secondary | ICD-10-CM | POA: Diagnosis not present

## 2023-02-15 DIAGNOSIS — Z419 Encounter for procedure for purposes other than remedying health state, unspecified: Secondary | ICD-10-CM | POA: Diagnosis not present

## 2023-03-05 ENCOUNTER — Ambulatory Visit: Payer: Medicaid Other | Admitting: Registered"

## 2023-03-17 DIAGNOSIS — Z419 Encounter for procedure for purposes other than remedying health state, unspecified: Secondary | ICD-10-CM | POA: Diagnosis not present

## 2023-04-17 DIAGNOSIS — Z419 Encounter for procedure for purposes other than remedying health state, unspecified: Secondary | ICD-10-CM | POA: Diagnosis not present

## 2023-05-18 DIAGNOSIS — Z419 Encounter for procedure for purposes other than remedying health state, unspecified: Secondary | ICD-10-CM | POA: Diagnosis not present

## 2023-05-31 IMAGING — US US PELVIS COMPLETE WITH TRANSVAGINAL
1 series · 14 of 25 positions shown · non-contrast
Comparison: 03/07/2020

CLINICAL DATA: Pelvic pain, LMP in September 2020 date not provided



[Series 1: us pelvic complete with transvaginal · 104 acquisitions, 14 frames shown]
[im 1/104]
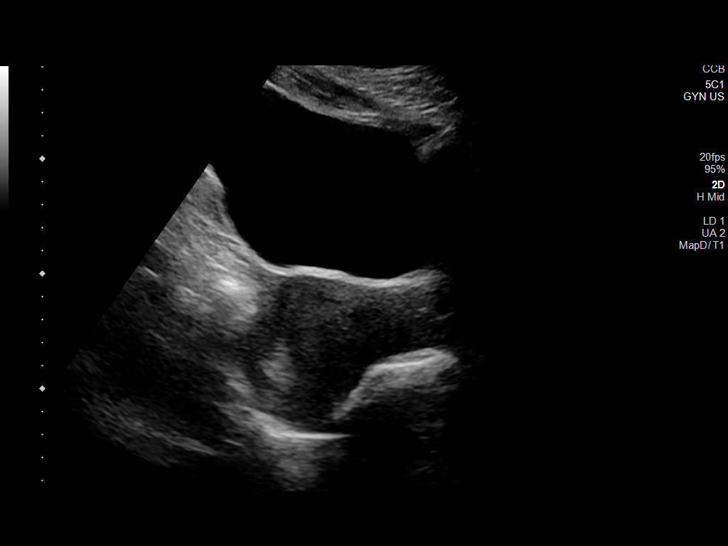
[im 9/104]
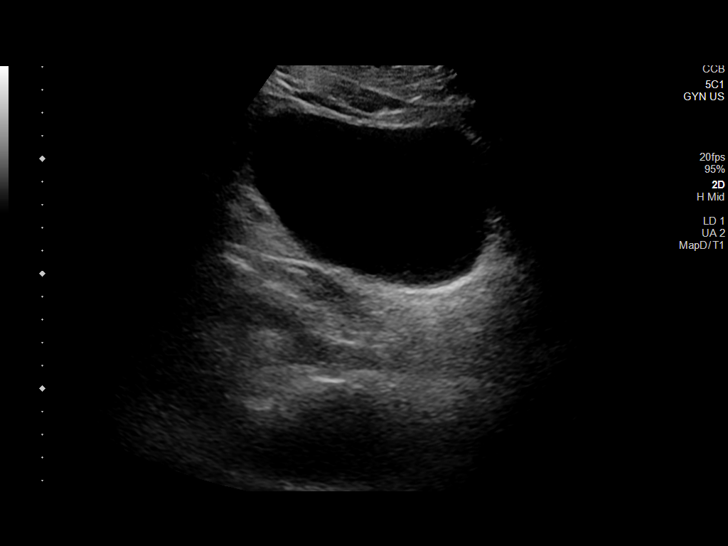
[im 18/104]
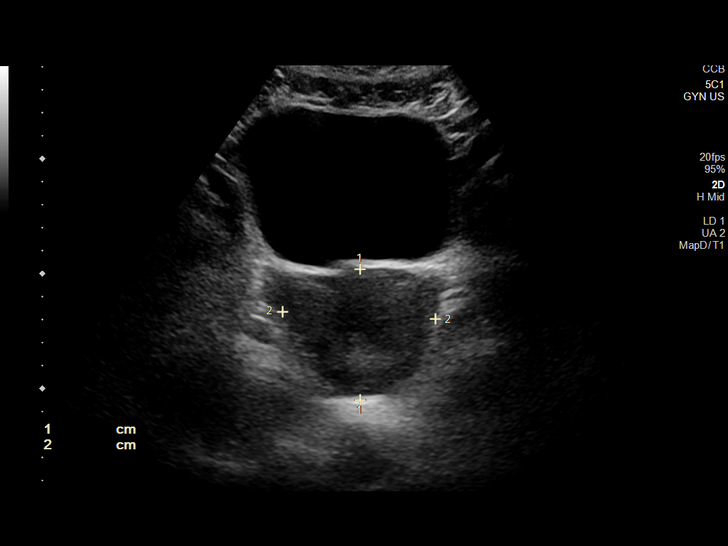
[im 26/104]
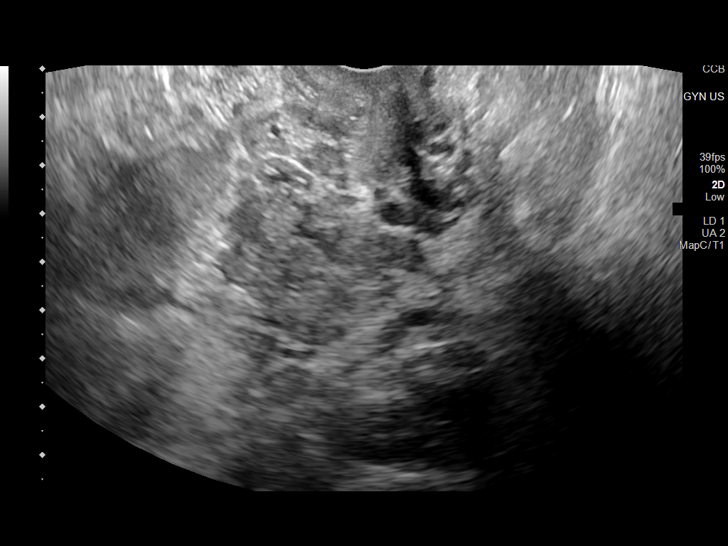
[im 35/104]
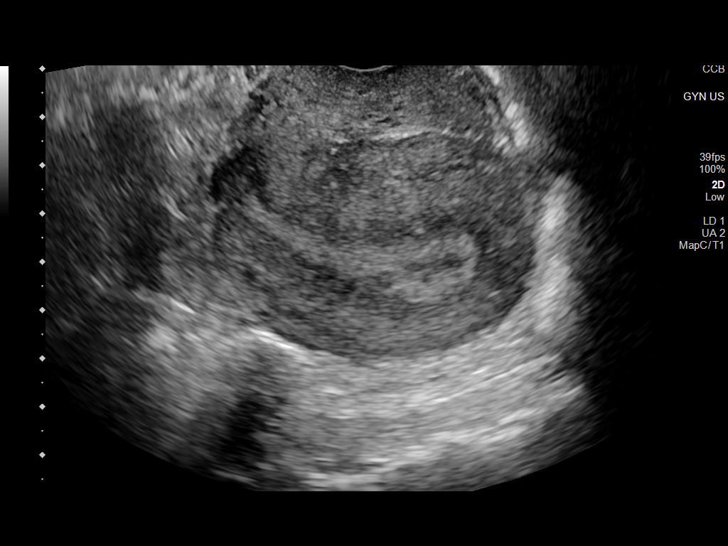
[im 39/104]
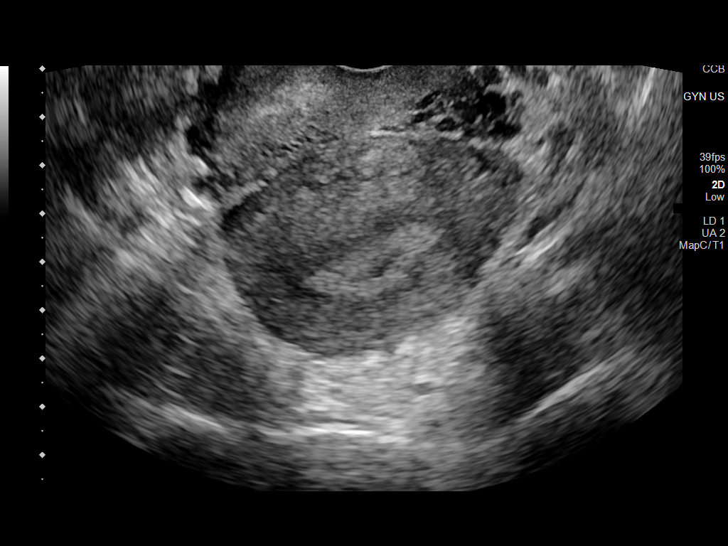
[im 48/104]
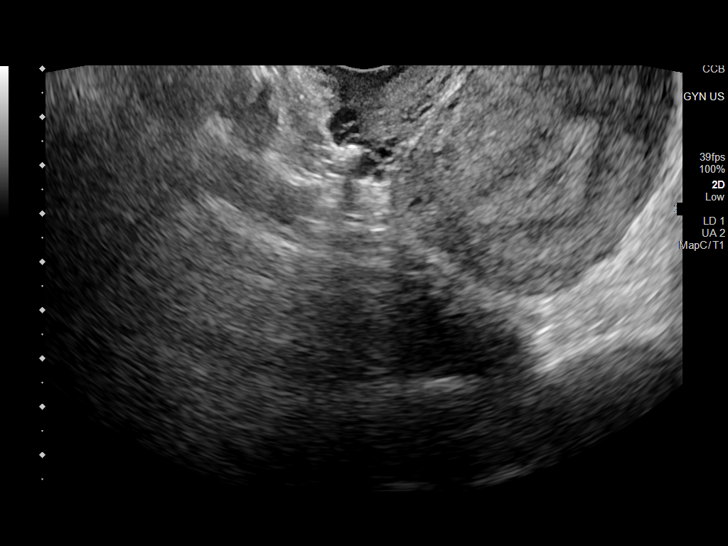
[im 56/104]
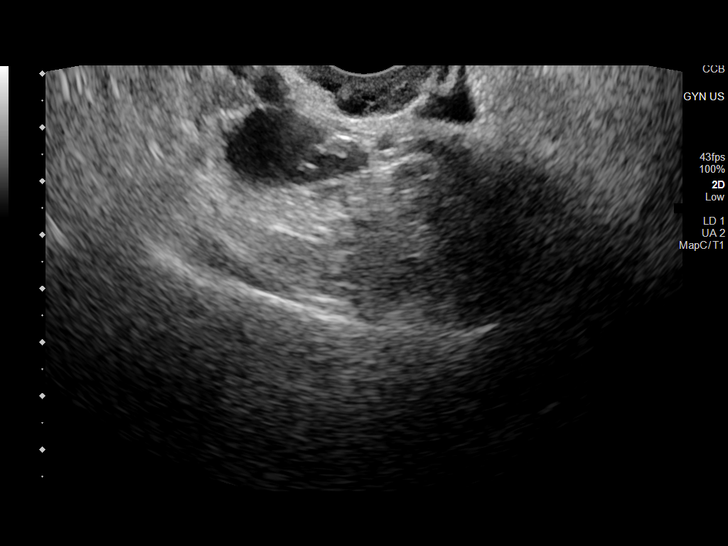
[im 65/104]
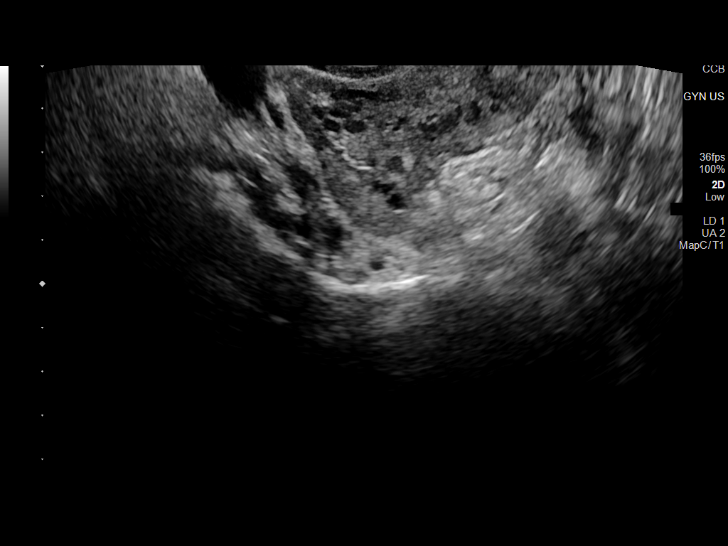
[im 69/104]
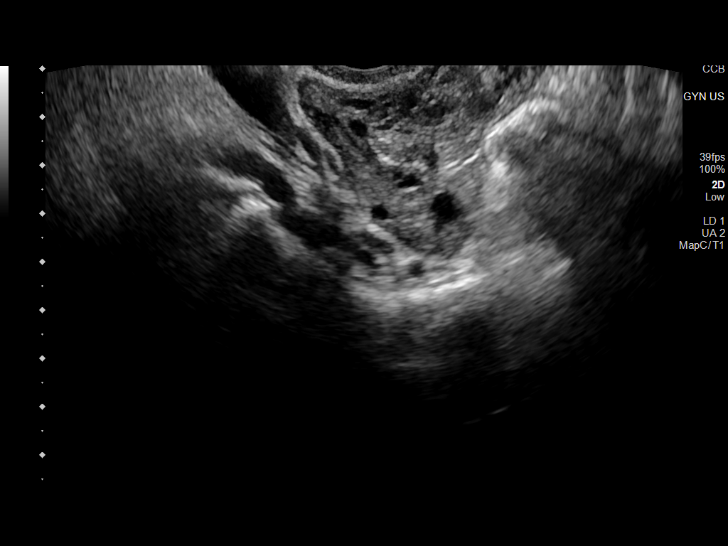
[im 78/104]
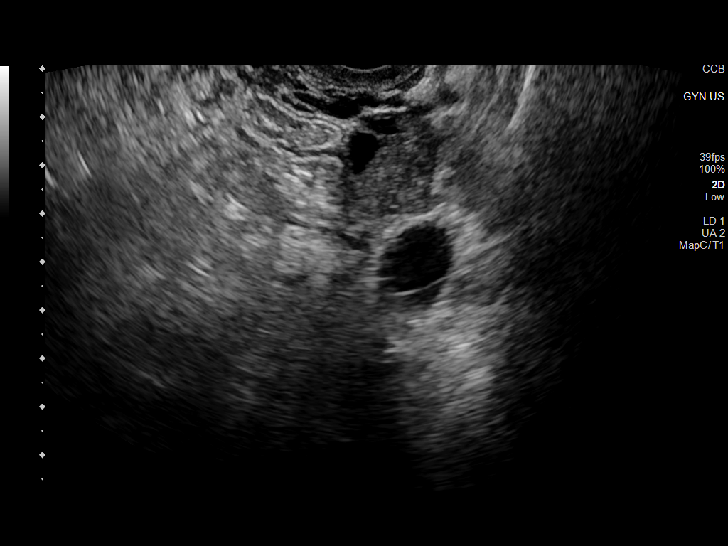
[im 86/104]
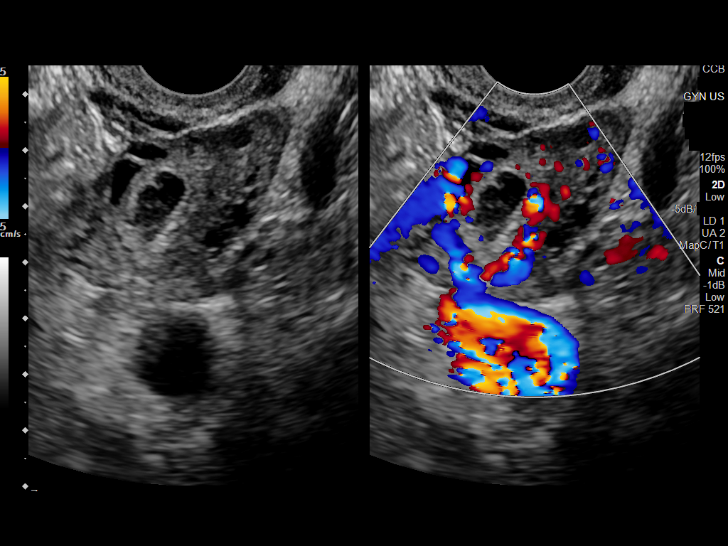
[im 95/104]
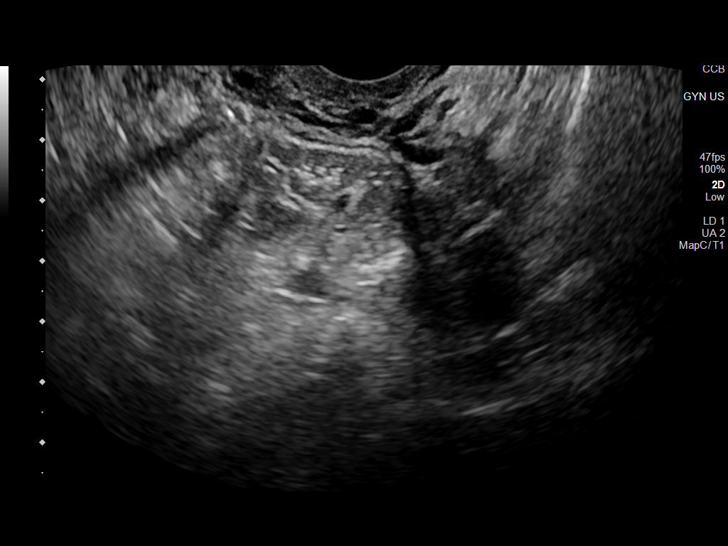
[im 104/104]
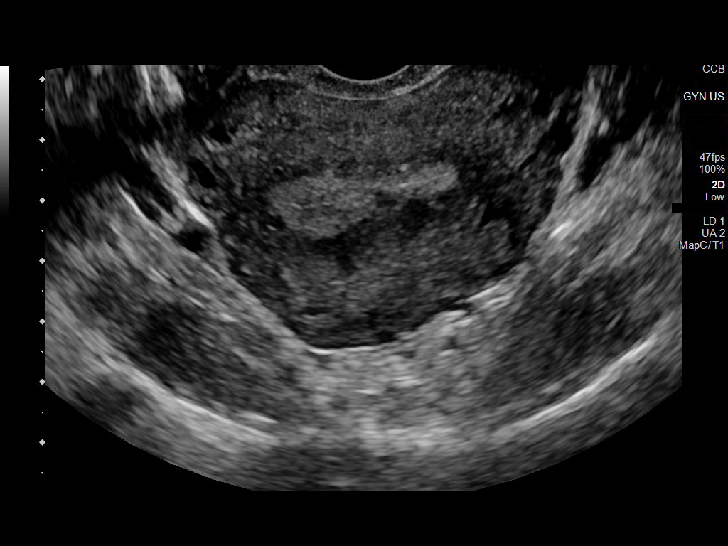

[14 of 25 positions shown; findings below may reference images not displayed]

FINDINGS: Uterus

Measurements: 7.4 x 4.8 x 6.6 cm = volume: 121 mL. Retroflexed.
Normal morphology without mass

Endometrium

Thickness: 9 mm. Trace nonspecific endometrial fluid at upper
uterine segment. No mass.

Right ovary

Measurements: 3.4 x 1.4 x 3.6 cm = volume: 9.2 mL. Normal morphology
without mass

Left ovary

Measurements: 4.1 x 3.2 x 2.5 cm = volume: 17.3 mL. Small resolving
corpus luteum; no follow-up imaging recommended. Otherwise normal
appearance.

Other findings

Trace free pelvic fluid.  No adnexal masses.
IMPRESSION: Trace nonspecific endometrial fluid.

No additional pelvic sonographic abnormalities.

## 2023-06-15 DIAGNOSIS — Z419 Encounter for procedure for purposes other than remedying health state, unspecified: Secondary | ICD-10-CM | POA: Diagnosis not present

## 2023-07-27 DIAGNOSIS — Z419 Encounter for procedure for purposes other than remedying health state, unspecified: Secondary | ICD-10-CM | POA: Diagnosis not present

## 2023-08-21 ENCOUNTER — Encounter: Payer: Self-pay | Admitting: Emergency Medicine

## 2023-08-21 ENCOUNTER — Ambulatory Visit: Admission: EM | Admit: 2023-08-21 | Discharge: 2023-08-21 | Disposition: A | Discharge status: Home / Self Care

## 2023-08-21 ENCOUNTER — Ambulatory Visit: Admit: 2023-08-21 | Discharge: 2023-08-21 | Disposition: A | Discharge status: Home / Self Care

## 2023-08-21 DIAGNOSIS — N760 Acute vaginitis: Secondary | ICD-10-CM

## 2023-08-21 DIAGNOSIS — B9689 Other specified bacterial agents as the cause of diseases classified elsewhere: Secondary | ICD-10-CM

## 2023-08-21 MED ORDER — METRONIDAZOLE 500 MG PO TABS: 500.0000 mg | ORAL_TABLET | Freq: Two times a day (BID) | ORAL | 0 refills | Status: DC

## 2023-08-21 NOTE — ED Triage Notes (Signed)
 Pt reports abnormal vaginal discharge x1 week.  Pt notes past hx of BV and states this feels similar. No med use at home.

## 2023-08-21 NOTE — ED Provider Notes (Signed)
 [facility]    CSN: [id number] Arrival date & time: 08/21/23  1512      History   Chief Complaint Chief Complaint  Patient presents with   Vaginal Discharge    HPI [name] is a 38 y.o. female.    Vaginal Discharge Patient here for today with a 1 week history of abnormal vaginal discharge without any itching.  Patient has a history of recurrent BV and feels that current symptoms are the same.  Patient has concerns for STIs. Past Medical History:  Diagnosis Date   Bacterial vaginal infection    S/P cesarean section 08/26/2011    Patient Active Problem List   Diagnosis Date Noted   Abnormal uterine and vaginal bleeding, unspecified 07/21/2020   S/P cesarean section 08/26/2011    Past Surgical History:  Procedure Laterality Date   CESAREAN SECTION     Breech, 2nd FTD   CESAREAN SECTION  08/23/2011   Procedure: CESAREAN SECTION;  Surgeon: Romilda Coaster, MD;  Location: [facility];  Service: Gynecology;  Laterality: N/A;  Repeat    OB History     Gravida  4   Para  4   Term  4   Preterm      AB      Living  4      SAB      IAB      Ectopic      Multiple      Live Births  4            Home Medications    Prior to Admission medications   Medication Sig Start Date End Date Taking? Authorizing Provider  Adapalene 0.3 % gel Apply topically. 07/25/23  Yes [provider]  clindamycin (CLEOCIN T) 1 % lotion Apply topically every morning. 07/25/23  Yes [provider]  DERMA-SMOOTHE/FS SCALP 0.01 % OIL APPLY TO AFFECTED AREAS OF SCALP TWICE DAILY FOR UP TO TWO WEEKS THEN USE AS NEEDED FOR MAINTENANCE. 07/26/23  Yes [provider]  ketoconazole (NIZORAL) 2 % shampoo SMARTSIG:sparingly Topical 07/25/23  Yes [provider]  metroNIDAZOLE  (FLAGYL ) 500 MG tablet Take 1 tablet (500 mg total) by mouth 2 (two) times daily. 08/21/23  Yes Buena Carmine, NP  ibuprofen  (ADVIL ) 800 MG tablet Take 1 tablet (800  mg total) by mouth every 8 (eight) hours as needed. Patient not taking: Reported on 09/19/2022 02/14/22   Gabrielle Joiner, MD  nystatin -triamcinolone  ointment (MYCOLOG) Apply 1 Application topically 2 (two) times daily. Patient not taking: Reported on 08/21/2023 09/19/22   Constant, Peggy, MD  norgestimate -ethinyl estradiol  (ORTHO-CYCLEN) 0.25-35 MG-MCG tablet Take 1 tablet by mouth daily. Patient not taking: Reported on 12/11/2019 05/18/19 12/24/19  Verle Gleason, CNM    Family History Family History  Problem Relation Age of Onset   Healthy Mother    Healthy Father     Social History Social History   Tobacco Use   Smoking status: Never   Smokeless tobacco: Never  Vaping Use   Vaping status: Never Used  Substance Use Topics   Alcohol use: No   Drug use: Not Currently    Types: Marijuana    Comment: Last used over a year ago     Allergies   Peanut-containing drug products   Review of Systems Review of Systems  Genitourinary:  Positive for vaginal discharge.     Physical Exam Triage Vital Signs ED Triage Vitals  Encounter Vitals Group  BP 08/21/23 1603 121/77     Systolic BP Percentile --      Diastolic BP Percentile --      Pulse Rate 08/21/23 1603 78     Resp 08/21/23 1603 16     Temp 08/21/23 1603 98.2 F (36.8 C)     Temp Source 08/21/23 1603 Oral     SpO2 08/21/23 1603 96 %     Weight --      Height --      Head Circumference --      Peak Flow --      Pain Score 08/21/23 1604 2     Pain Loc --      Pain Education --      Exclude from Growth Chart --    No data found.  Updated Vital Signs BP 121/77 (BP Location: Left Arm)   Pulse 78   Temp 98.2 F (36.8 C) (Oral)   Resp 16   LMP 07/21/2023 (Approximate)   SpO2 96%   Visual Acuity Right Eye Distance:   Left Eye Distance:   Bilateral Distance:    Right Eye Near:   Left Eye Near:    Bilateral Near:     Physical Exam Vitals reviewed.  Constitutional:      Appearance: Normal appearance.   HENT:     Head: Normocephalic and atraumatic.  Eyes:     Extraocular Movements: Extraocular movements intact.     Pupils: Pupils are equal, round, and reactive to light.  Cardiovascular:     Rate and Rhythm: Normal rate and regular rhythm.  Pulmonary:     Effort: Pulmonary effort is normal.     Breath sounds: Normal breath sounds.  Musculoskeletal:     Cervical back: Normal range of motion and neck supple.  Skin:    General: Skin is warm and dry.  Neurological:     General: No focal deficit present.     Mental Status: She is alert.      UC Treatments / Results  Labs (all labs ordered are listed, but only abnormal results are displayed) Labs Reviewed - No data to display  EKG   Radiology No results found.  Procedures Procedures (including critical care time)  Medications Ordered in UC Medications - No data to display  Initial Impression / Assessment and Plan / UC Course  I have reviewed the triage vital signs and the nursing notes.  Pertinent labs & imaging results that were available during my care of the patient were reviewed by me and considered in my medical decision making (see chart for details).    Treating for bacterial vaginosis empirically with metronidazole  100 mg twice daily for 7 days.  Testing deferred as patient has a history of recurrent BV and does not concern for STDs today.  Patient advised that if symptoms do not completely resolved to return for reevaluation. Final Clinical Impressions(s) / UC Diagnoses   Final diagnoses:  Bacterial vaginitis     Discharge Instructions      Take meds vaginitis  100 mg twice daily for 7 days to treat BV.  If your symptoms  have not improved after completing treatment return for reevaluation.     ED Prescriptions     Medication Sig Dispense Auth. Provider   metroNIDAZOLE  (FLAGYL ) 500 MG tablet Take 1 tablet (500 mg total) by mouth 2 (two) times daily. 14 tablet Buena Carmine, NP      PDMP not  reviewed this encounter.   Raquel Cables,  Alyse July, NP 08/21/23 [phone]

## 2023-08-21 NOTE — Discharge Instructions (Signed)
 Take meds vaginitis  100 mg twice daily for 7 days to treat BV.  If your symptoms  have not improved after completing treatment return for reevaluation.

## 2023-08-26 DIAGNOSIS — Z419 Encounter for procedure for purposes other than remedying health state, unspecified: Secondary | ICD-10-CM | POA: Diagnosis not present

## 2023-09-23 ENCOUNTER — Telehealth: Admitting: Physician Assistant

## 2023-09-23 DIAGNOSIS — B9689 Other specified bacterial agents as the cause of diseases classified elsewhere: Secondary | ICD-10-CM | POA: Diagnosis not present

## 2023-09-23 DIAGNOSIS — N760 Acute vaginitis: Secondary | ICD-10-CM | POA: Diagnosis not present

## 2023-09-23 MED ORDER — METRONIDAZOLE 500 MG PO TABS: 500.0000 mg | ORAL_TABLET | Freq: Two times a day (BID) | ORAL | 0 refills | Status: AC

## 2023-09-23 NOTE — Patient Instructions (Signed)
 [name], thank you for joining Angelia Kelp, PA-C for today's virtual visit.  While this provider is not your primary care provider (PCP), if your PCP is located in our provider database this encounter information will be shared with them immediately following your visit.   A [facility] MyChart account gives you access to today's visit and all your visits, tests, and labs performed at [facility] " click here if you don't have a [facility] MyChart account or go to [email or web address]  Consent: (Patient) [name] provided verbal consent for this virtual visit at the beginning of the encounter.  Current Medications:  Current Outpatient Medications:    metroNIDAZOLE  (FLAGYL ) 500 MG tablet, Take 1 tablet (500 mg total) by mouth 2 (two) times daily for 7 days., Disp: 14 tablet, Rfl: 0   Adapalene 0.3 % gel, Apply topically., Disp: , Rfl:    clindamycin (CLEOCIN T) 1 % lotion, Apply topically every morning., Disp: , Rfl:    DERMA-SMOOTHE/FS SCALP 0.01 % OIL, APPLY TO AFFECTED AREAS OF SCALP TWICE DAILY FOR UP TO TWO WEEKS THEN USE AS NEEDED FOR MAINTENANCE., Disp: , Rfl:    ibuprofen  (ADVIL ) 800 MG tablet, Take 1 tablet (800 mg total) by mouth every 8 (eight) hours as needed. (Patient not taking: Reported on 09/19/2022), Disp: 30 tablet, Rfl: 5   ketoconazole (NIZORAL) 2 % shampoo, SMARTSIG:sparingly Topical, Disp: , Rfl:    nystatin -triamcinolone  ointment (MYCOLOG), Apply 1 Application topically 2 (two) times daily. (Patient not taking: Reported on 08/21/2023), Disp: 30 g, Rfl: 0   Medications ordered in this encounter:  Meds ordered this encounter  Medications   metroNIDAZOLE  (FLAGYL ) 500 MG tablet    Sig: Take 1 tablet (500 mg total) by mouth 2 (two) times daily for 7 days.    Dispense:  14 tablet    Refill:  0    Supervising Provider:   Corine Dice [[id number]]     *If you need refills on other medications prior to your next appointment, please  contact your pharmacy*  Follow-Up: Call back or seek an in-person evaluation if the symptoms worsen or if the condition fails to improve as anticipated.  [facility] [phone]  Other Instructions Vaginal Probiotics: AZO vaginal probiotic OLLY Happy Hoo-Ha RAW Vaginal Care RenewLife Women's vaginal probiotic RepHresh Pro-B  Vaginal washes: Honey Pot Summer's Eve Vagisil Feminine cleanser  Boric Acid Suppositories  Healthy vaginal hygiene practices    -  Avoid sleeper pajamas. Nightgowns allow air to circulate.  Sleep without underpants whenever possible.   -  Wear cotton underpants during the day. Double-rinse underwear after washing to avoid residual irritants. Do not use fabric softeners for underwear and swimsuits.   - Avoid tights, leotards, leggings, "skinny" jeans, and other tight-fitting clothing. Skirts and loose-fitting pants allow air to circulate.   - Avoid pantyliners.  Instead use tampons or cotton pads.   - Use the restroom after intercourse to help prevent UTI's   - Daily warm bathing is helpful:     - Soak in clean water (no soap) for 10 to 15 minutes. Adding vinegar or baking soda to the water has not been specifically studied and may not be better than clean water alone.      - Use soap to wash regions other than the genital area just before getting out of the tub. Limit use of any soap on genital areas. Use fragance-free soaps.     - Rinse the genital  area well and gently pat dry.  Don't rub.  Hair dryer to assist with drying can be used only if on cool setting.     - Do not use bubble baths or perfumed soaps.   - Do not use any feminine sprays, douches or powders.  These contain chemicals that will irritate the skin.   - If the genital area is tender or swollen, cool compresses may relieve the discomfort. Unscented wet wipes can be used instead of toilet paper for wiping.    - Emollients, such as Vaseline, may help protect skin and can  be applied to the irritated area.   - Always remember to wipe front-to-back after bowel movements. Pat dry after urination.   - Do not sit in wet swimsuits for long periods of time after swimming    If you have been instructed to have an in-person evaluation today at a local Urgent Care facility, please use the link below. It will take you to a list of all of our available [facility], including address, phone number and hours of operation. Please do not delay care.  [facility]  If you or a family member do not have a primary care provider, use the link below to schedule a visit and establish care. When you choose a [facility] primary care physician or advanced practice provider, you gain a long-term partner in health. Find a Primary Care Provider  Learn more about [facility]'s in-office and virtual care options: [facility] - Get Care Now

## 2023-09-23 NOTE — Progress Notes (Signed)
 Virtual Visit Consent   [name], you are scheduled for a virtual visit with a [facility] provider today. Just as with appointments in the office, your consent must be obtained to participate. Your consent will be active for this visit and any virtual visit you may have with one of our providers in the next 365 days. If you have a MyChart account, a copy of this consent can be sent to you electronically.  As this is a virtual visit, video technology does not allow for your provider to perform a traditional examination. This may limit your provider's ability to fully assess your condition. If your provider identifies any concerns that need to be evaluated in person or the need to arrange testing (such as labs, EKG, etc.), we will make arrangements to do so. Although advances in technology are sophisticated, we cannot ensure that it will always work on either your end or our end. If the connection with a video visit is poor, the visit may have to be switched to a telephone visit. With either a video or telephone visit, we are not always able to ensure that we have a secure connection.  By engaging in this virtual visit, you consent to the provision of healthcare and authorize for your insurance to be billed (if applicable) for the services provided during this visit. Depending on your insurance coverage, you may receive a charge related to this service.  I need to obtain your verbal consent now. Are you willing to proceed with your visit today? [name] has provided verbal consent on 09/23/2023 for a virtual visit (video or telephone). Angelia Kelp, PA-C  Date: 09/23/2023 7:55 AM   Virtual Visit via Video Note   I, Angelia Kelp, connected with  [name]  ([id number], [birth year]) on 09/23/23 at  7:45 AM EDT by a video-enabled telemedicine application and verified that I am speaking with the correct person using two identifiers.  Location: Patient: Virtual Visit  Location Patient: Home Provider: Virtual Visit Location Provider: Home Office   I discussed the limitations of evaluation and management by telemedicine and the availability of in person appointments. The patient expressed understanding and agreed to proceed.    History of Present Illness: [name] is a 38 y.o. who identifies as a female who was assigned female at birth, and is being seen today for vaginal discharge.  HPI: Vaginal Discharge The patient's primary symptoms include a genital odor and vaginal discharge. The patient's pertinent negatives include no genital itching, missed menses or vaginal bleeding. This is a recurrent problem. The current episode started in the past 7 days (3-4 days ago). The problem occurs constantly. The problem has been gradually worsening. The patient is experiencing no pain. She is not pregnant. Pertinent negatives include no abdominal pain, back pain, chills, constipation, diarrhea, dysuria, fever, flank pain, frequency, hematuria, nausea, painful intercourse, urgency or vomiting. The vaginal discharge was malodorous, mucoid and thin. There has been no bleeding. She has not been passing clots. She has not been passing tissue. Nothing aggravates the symptoms. She has tried nothing for the symptoms. The treatment provided no relief.     Problems:  Patient Active Problem List   Diagnosis Date Noted   Abnormal uterine and vaginal bleeding, unspecified 07/21/2020   S/P cesarean section 08/26/2011    Allergies:  Allergies  Allergen Reactions   Peanut-Containing Drug Products Itching and Rash    All nuts   Medications:  Current Outpatient Medications:  metroNIDAZOLE  (FLAGYL ) 500 MG tablet, Take 1 tablet (500 mg total) by mouth 2 (two) times daily for 7 days., Disp: 14 tablet, Rfl: 0   Adapalene 0.3 % gel, Apply topically., Disp: , Rfl:    clindamycin (CLEOCIN T) 1 % lotion, Apply topically every morning., Disp: , Rfl:    DERMA-SMOOTHE/FS SCALP 0.01 %  OIL, APPLY TO AFFECTED AREAS OF SCALP TWICE DAILY FOR UP TO TWO WEEKS THEN USE AS NEEDED FOR MAINTENANCE., Disp: , Rfl:    ibuprofen  (ADVIL ) 800 MG tablet, Take 1 tablet (800 mg total) by mouth every 8 (eight) hours as needed. (Patient not taking: Reported on 09/19/2022), Disp: 30 tablet, Rfl: 5   ketoconazole (NIZORAL) 2 % shampoo, SMARTSIG:sparingly Topical, Disp: , Rfl:    nystatin -triamcinolone  ointment (MYCOLOG), Apply 1 Application topically 2 (two) times daily. (Patient not taking: Reported on 08/21/2023), Disp: 30 g, Rfl: 0  Observations/Objective: Patient is well-developed, well-nourished in no acute distress.  Resting comfortably at home.  Head is normocephalic, atraumatic.  No labored breathing.  Speech is clear and coherent with logical content.  Patient is alert and oriented at baseline.    Assessment and Plan: 1. BV (bacterial vaginosis) (Primary) - metroNIDAZOLE  (FLAGYL ) 500 MG tablet; Take 1 tablet (500 mg total) by mouth 2 (two) times daily for 7 days.  Dispense: 14 tablet; Refill: 0  - Symptoms consistent with BV - Metronidazole  prescribed - Limit bubble baths, scented lotions/soaps/detergents - Limit tight fitting clothing - Seek on person evaluation if not improving or if symptoms worsen   Follow Up Instructions: I discussed the assessment and treatment plan with the patient. The patient was provided an opportunity to ask questions and all were answered. The patient agreed with the plan and demonstrated an understanding of the instructions.  A copy of instructions were sent to the patient via MyChart unless otherwise noted below.    The patient was advised to call back or seek an in-person evaluation if the symptoms worsen or if the condition fails to improve as anticipated.    Angelia Kelp, PA-C

## 2023-09-26 DIAGNOSIS — Z419 Encounter for procedure for purposes other than remedying health state, unspecified: Secondary | ICD-10-CM | POA: Diagnosis not present

## 2023-10-15 ENCOUNTER — Ambulatory Visit

## 2023-10-15 ENCOUNTER — Other Ambulatory Visit (HOSPITAL_COMMUNITY)
Admission: RE | Admit: 2023-10-15 | Discharge: 2023-10-15 | Disposition: A | Discharge status: Home / Self Care | Source: Ambulatory Visit | Attending: Obstetrics and Gynecology | Admitting: Obstetrics and Gynecology

## 2023-10-15 VITALS — BP 120/74 | HR 79

## 2023-10-15 DIAGNOSIS — N898 Other specified noninflammatory disorders of vagina: Secondary | ICD-10-CM

## 2023-10-15 NOTE — Progress Notes (Signed)
 SUBJECTIVE:  38 y.o. female complains of white vaginal discharge for 1 week(s). Denies abnormal vaginal bleeding or significant pelvic pain or fever. No UTI symptoms. Denies history of known exposure to STD.  No LMP recorded.  OBJECTIVE:  She appears well, afebrile. Urine dipstick: not done.  ASSESSMENT:  Vaginal Discharge  Vaginal Irritation   PLAN:  GC, chlamydia, trichomonas, BVAG, CVAG probe sent to lab. Treatment: To be determined once lab results are received ROV prn if symptoms persist or worsen.  Pt completed an online BV visit, and took a course of metronidazole . Pt also took OTC yeast infection medication afterwards. Still having some irritation symptoms as well as discharge.

## 2023-10-17 ENCOUNTER — Ambulatory Visit: Payer: Self-pay | Admitting: Obstetrics and Gynecology

## 2023-10-17 LAB — CERVICOVAGINAL ANCILLARY ONLY
Bacterial Vaginitis (gardnerella): NEGATIVE
Candida Glabrata: NEGATIVE
Candida Vaginitis: NEGATIVE
Chlamydia: NEGATIVE
Comment: NEGATIVE
Comment: NEGATIVE
Comment: NEGATIVE
Comment: NEGATIVE
Comment: NEGATIVE
Comment: NORMAL
Neisseria Gonorrhea: NEGATIVE
Trichomonas: NEGATIVE

## 2023-10-26 DIAGNOSIS — Z419 Encounter for procedure for purposes other than remedying health state, unspecified: Secondary | ICD-10-CM | POA: Diagnosis not present

## 2023-11-26 DIAGNOSIS — Z419 Encounter for procedure for purposes other than remedying health state, unspecified: Secondary | ICD-10-CM | POA: Diagnosis not present

## 2023-11-27 ENCOUNTER — Ambulatory Visit: Payer: Self-pay

## 2023-12-10 ENCOUNTER — Ambulatory Visit (INDEPENDENT_AMBULATORY_CARE_PROVIDER_SITE_OTHER)

## 2023-12-10 ENCOUNTER — Other Ambulatory Visit (HOSPITAL_COMMUNITY)
Admission: RE | Admit: 2023-12-10 | Discharge: 2023-12-10 | Disposition: A | Discharge status: Home / Self Care | Source: Ambulatory Visit | Attending: Obstetrics and Gynecology | Admitting: Obstetrics and Gynecology

## 2023-12-10 VITALS — BP 132/85 | HR 84

## 2023-12-10 DIAGNOSIS — N898 Other specified noninflammatory disorders of vagina: Secondary | ICD-10-CM | POA: Insufficient documentation

## 2023-12-10 MED ORDER — FLUCONAZOLE 150 MG PO TABS
150.0000 mg | ORAL_TABLET | Freq: Once | ORAL | 0 refills | Status: AC
Start: 2023-12-10 — End: 2023-12-10

## 2023-12-10 NOTE — Progress Notes (Signed)
 SUBJECTIVE:  37 y.o. female complains of itching, burning, chunky, clear, and [name] vaginal discharge for 4 day(s). Denies abnormal vaginal bleeding or significant pelvic pain or fever. No UTI symptoms. Denies history of known exposure to STD.  Patient's last menstrual period was 11/13/2023 (approximate).  OBJECTIVE:  She appears well, afebrile. Urine dipstick: not done.  ASSESSMENT:  Vaginal Discharge  Vaginal Itching   PLAN:  GC, chlamydia, trichomonas, BVAG, CVAG probe sent to lab. Treatment: Symptoms consistent with yeast infection. Diflucan  sent to pharmacy. Further treatment be determined once lab results are received ROV prn if symptoms persist or worsen.

## 2023-12-11 LAB — CERVICOVAGINAL ANCILLARY ONLY
Bacterial Vaginitis (gardnerella): POSITIVE — AB
Candida Glabrata: NEGATIVE
Candida Vaginitis: POSITIVE — AB
Chlamydia: NEGATIVE
Comment: NEGATIVE
Comment: NEGATIVE
Comment: NEGATIVE
Comment: NEGATIVE
Comment: NEGATIVE
Comment: NORMAL
Neisseria Gonorrhea: NEGATIVE
Trichomonas: NEGATIVE

## 2023-12-12 ENCOUNTER — Ambulatory Visit: Payer: Self-pay | Admitting: Obstetrics and Gynecology

## 2023-12-12 DIAGNOSIS — B9689 Other specified bacterial agents as the cause of diseases classified elsewhere: Secondary | ICD-10-CM

## 2023-12-12 MED ORDER — METRONIDAZOLE 0.75 % VA GEL: 1.0000 | Freq: Every day | VAGINAL | 1 refills | Status: AC

## 2023-12-27 DIAGNOSIS — Z419 Encounter for procedure for purposes other than remedying health state, unspecified: Secondary | ICD-10-CM | POA: Diagnosis not present

## 2024-05-04 ENCOUNTER — Other Ambulatory Visit (HOSPITAL_COMMUNITY)
Admission: RE | Admit: 2024-05-04 | Discharge: 2024-05-04 | Disposition: A | Discharge status: Home / Self Care | Source: Ambulatory Visit | Attending: Obstetrics | Admitting: Obstetrics

## 2024-05-04 ENCOUNTER — Ambulatory Visit: Payer: Self-pay

## 2024-05-04 VITALS — BP 131/82 | HR 81

## 2024-05-04 DIAGNOSIS — Z113 Encounter for screening for infections with a predominantly sexual mode of transmission: Secondary | ICD-10-CM | POA: Diagnosis present

## 2024-05-04 DIAGNOSIS — N898 Other specified noninflammatory disorders of vagina: Secondary | ICD-10-CM | POA: Diagnosis not present

## 2024-05-04 NOTE — Progress Notes (Signed)
 SUBJECTIVE:  39 y.o. female complains of thin vaginal discharge and odor for 7-14 day(s). Denies abnormal vaginal bleeding or significant pelvic pain or fever. No UTI symptoms. Denies history of known exposure to STD.  No LMP recorded.  OBJECTIVE:  She appears well, afebrile. Urine dipstick: not done.  ASSESSMENT:  Vaginal Discharge  Vaginal Odor   PLAN:  GC, chlamydia, trichomonas, BVAG, CVAG probe sent to lab. Treatment: To be determined once lab results are received ROV prn if symptoms persist or worsen.

## 2024-05-05 ENCOUNTER — Ambulatory Visit: Payer: Self-pay | Admitting: Obstetrics and Gynecology

## 2024-05-05 LAB — CERVICOVAGINAL ANCILLARY ONLY
Bacterial Vaginitis (gardnerella): POSITIVE — AB
Candida Glabrata: NEGATIVE
Candida Vaginitis: NEGATIVE
Chlamydia: NEGATIVE
Comment: NEGATIVE
Comment: NEGATIVE
Comment: NEGATIVE
Comment: NEGATIVE
Comment: NEGATIVE
Comment: NORMAL
Neisseria Gonorrhea: NEGATIVE
Trichomonas: NEGATIVE

## 2024-05-05 MED ORDER — METRONIDAZOLE 500 MG PO TABS: 500.0000 mg | ORAL_TABLET | Freq: Two times a day (BID) | ORAL | 0 refills | Status: AC
